# Patient Record
Sex: Female | Born: 1935 | Race: White | Hispanic: No | Marital: Married | Smoking: Never smoker
Health system: Southern US, Community
[De-identification: ages and names within clinical notes are randomized; demographics above are authoritative.]

## PROBLEM LIST (undated history)

## (undated) DIAGNOSIS — I2510 Atherosclerotic heart disease of native coronary artery without angina pectoris: Secondary | ICD-10-CM

## (undated) DIAGNOSIS — I480 Paroxysmal atrial fibrillation: Secondary | ICD-10-CM

## (undated) DIAGNOSIS — I10 Essential (primary) hypertension: Secondary | ICD-10-CM

## (undated) DIAGNOSIS — M8580 Other specified disorders of bone density and structure, unspecified site: Secondary | ICD-10-CM

## (undated) DIAGNOSIS — L570 Actinic keratosis: Secondary | ICD-10-CM

## (undated) DIAGNOSIS — M791 Myalgia, unspecified site: Secondary | ICD-10-CM

## (undated) DIAGNOSIS — C801 Malignant (primary) neoplasm, unspecified: Secondary | ICD-10-CM

## (undated) DIAGNOSIS — H409 Unspecified glaucoma: Secondary | ICD-10-CM

## (undated) DIAGNOSIS — I7300 Raynaud's syndrome without gangrene: Secondary | ICD-10-CM

## (undated) DIAGNOSIS — I252 Old myocardial infarction: Secondary | ICD-10-CM

## (undated) HISTORY — PX: BREAST SURGERY: SHX581

---

## 2000-02-02 DIAGNOSIS — I252 Old myocardial infarction: Secondary | ICD-10-CM

## 2000-02-02 HISTORY — DX: Old myocardial infarction: I25.2

## 2006-04-06 ENCOUNTER — Emergency Department (HOSPITAL_COMMUNITY): Admission: EM | Admit: 2006-04-06 | Discharge: 2006-04-06 | Payer: Self-pay | Admitting: Emergency Medicine

## 2007-03-25 ENCOUNTER — Emergency Department: Payer: Self-pay | Admitting: Emergency Medicine

## 2008-09-16 ENCOUNTER — Ambulatory Visit: Payer: Self-pay | Admitting: Internal Medicine

## 2008-11-30 ENCOUNTER — Emergency Department: Payer: Self-pay | Admitting: Emergency Medicine

## 2009-05-19 ENCOUNTER — Ambulatory Visit: Payer: Self-pay | Admitting: Internal Medicine

## 2009-10-04 ENCOUNTER — Ambulatory Visit: Payer: Self-pay | Admitting: Internal Medicine

## 2010-06-11 ENCOUNTER — Ambulatory Visit: Payer: Self-pay | Admitting: Urology

## 2010-08-31 DIAGNOSIS — Z8589 Personal history of malignant neoplasm of other organs and systems: Secondary | ICD-10-CM | POA: Insufficient documentation

## 2010-08-31 DIAGNOSIS — Z853 Personal history of malignant neoplasm of breast: Secondary | ICD-10-CM | POA: Insufficient documentation

## 2010-08-31 DIAGNOSIS — K5909 Other constipation: Secondary | ICD-10-CM | POA: Insufficient documentation

## 2010-08-31 DIAGNOSIS — H4020X0 Unspecified primary angle-closure glaucoma, stage unspecified: Secondary | ICD-10-CM | POA: Insufficient documentation

## 2010-08-31 DIAGNOSIS — L209 Atopic dermatitis, unspecified: Secondary | ICD-10-CM | POA: Insufficient documentation

## 2010-08-31 DIAGNOSIS — Z872 Personal history of diseases of the skin and subcutaneous tissue: Secondary | ICD-10-CM | POA: Insufficient documentation

## 2010-08-31 DIAGNOSIS — G4700 Insomnia, unspecified: Secondary | ICD-10-CM | POA: Insufficient documentation

## 2011-03-31 DIAGNOSIS — M8580 Other specified disorders of bone density and structure, unspecified site: Secondary | ICD-10-CM | POA: Insufficient documentation

## 2011-03-31 DIAGNOSIS — I480 Paroxysmal atrial fibrillation: Secondary | ICD-10-CM | POA: Insufficient documentation

## 2011-03-31 DIAGNOSIS — M791 Myalgia, unspecified site: Secondary | ICD-10-CM | POA: Insufficient documentation

## 2012-03-30 DIAGNOSIS — I7300 Raynaud's syndrome without gangrene: Secondary | ICD-10-CM | POA: Insufficient documentation

## 2013-04-17 DIAGNOSIS — R0989 Other specified symptoms and signs involving the circulatory and respiratory systems: Secondary | ICD-10-CM | POA: Insufficient documentation

## 2013-09-07 DIAGNOSIS — I10 Essential (primary) hypertension: Secondary | ICD-10-CM | POA: Insufficient documentation

## 2013-09-07 DIAGNOSIS — J302 Other seasonal allergic rhinitis: Secondary | ICD-10-CM | POA: Insufficient documentation

## 2014-04-08 DIAGNOSIS — E7800 Pure hypercholesterolemia, unspecified: Secondary | ICD-10-CM | POA: Insufficient documentation

## 2014-04-08 DIAGNOSIS — R252 Cramp and spasm: Secondary | ICD-10-CM | POA: Insufficient documentation

## 2014-04-08 DIAGNOSIS — I25118 Atherosclerotic heart disease of native coronary artery with other forms of angina pectoris: Secondary | ICD-10-CM | POA: Insufficient documentation

## 2014-08-11 ENCOUNTER — Encounter: Payer: Self-pay | Admitting: Emergency Medicine

## 2014-08-11 ENCOUNTER — Emergency Department
Admission: EM | Admit: 2014-08-11 | Discharge: 2014-08-12 | Disposition: A | Discharge status: Home / Self Care | Payer: Medicare Other | Attending: Emergency Medicine | Admitting: Emergency Medicine

## 2014-08-11 DIAGNOSIS — Z880 Allergy status to penicillin: Secondary | ICD-10-CM | POA: Diagnosis not present

## 2014-08-11 DIAGNOSIS — L03116 Cellulitis of left lower limb: Secondary | ICD-10-CM | POA: Insufficient documentation

## 2014-08-11 DIAGNOSIS — L539 Erythematous condition, unspecified: Secondary | ICD-10-CM | POA: Diagnosis present

## 2014-08-11 DIAGNOSIS — Z87828 Personal history of other (healed) physical injury and trauma: Secondary | ICD-10-CM | POA: Diagnosis not present

## 2014-08-11 DIAGNOSIS — I10 Essential (primary) hypertension: Secondary | ICD-10-CM | POA: Insufficient documentation

## 2014-08-11 HISTORY — DX: Other specified disorders of bone density and structure, unspecified site: M85.80

## 2014-08-11 HISTORY — DX: Myalgia, unspecified site: M79.10

## 2014-08-11 HISTORY — DX: Essential (primary) hypertension: I10

## 2014-08-11 HISTORY — DX: Unspecified glaucoma: H40.9

## 2014-08-11 HISTORY — DX: Malignant (primary) neoplasm, unspecified: C80.1

## 2014-08-11 HISTORY — DX: Old myocardial infarction: I25.2

## 2014-08-11 HISTORY — DX: Actinic keratosis: L57.0

## 2014-08-11 HISTORY — DX: Paroxysmal atrial fibrillation: I48.0

## 2014-08-11 HISTORY — DX: Atherosclerotic heart disease of native coronary artery without angina pectoris: I25.10

## 2014-08-11 HISTORY — DX: Raynaud's syndrome without gangrene: I73.00

## 2014-08-11 NOTE — ED Notes (Signed)
Hit left shin on stool 10 days ago and has become more red

## 2014-08-11 NOTE — ED Notes (Signed)
Attempted iv x 3 with no success.

## 2014-08-12 DIAGNOSIS — L03116 Cellulitis of left lower limb: Secondary | ICD-10-CM | POA: Diagnosis not present

## 2014-08-12 LAB — BASIC METABOLIC PANEL
Anion gap: 9 (ref 5–15)
BUN: 17 mg/dL (ref 6–20)
CHLORIDE: 103 mmol/L (ref 101–111)
CO2: 27 mmol/L (ref 22–32)
Calcium: 8.9 mg/dL (ref 8.9–10.3)
Creatinine, Ser: 0.93 mg/dL (ref 0.44–1.00)
GFR calc Af Amer: >60 mL/min (ref >60)
GFR, EST NON AFRICAN AMERICAN: 56 mL/min — AB (ref >60)
Glucose, Bld: 153 mg/dL — ABNORMAL HIGH (ref 65–99)
POTASSIUM: 4.2 mmol/L (ref 3.5–5.1)
Sodium: 139 mmol/L (ref 135–145)

## 2014-08-12 LAB — CBC
HEMATOCRIT: 38.4 % (ref 35.0–47.0)
Hemoglobin: 12.6 g/dL (ref 12.0–16.0)
MCH: 30.5 pg (ref 26.0–34.0)
MCHC: 32.8 g/dL (ref 32.0–36.0)
MCV: 92.9 fL (ref 80.0–100.0)
PLATELETS: 177 10*3/uL (ref 150–440)
RBC: 4.13 MIL/uL (ref 3.80–5.20)
RDW: 14.1 % (ref 11.5–14.5)
WBC: 5.8 10*3/uL (ref 3.6–11.0)

## 2014-08-12 MED ORDER — CLINDAMYCIN HCL 150 MG PO CAPS
300.0000 mg | ORAL_CAPSULE | Freq: Once | ORAL | Status: AC
  Administered 2014-08-12: 300 mg via ORAL
  Filled 2014-08-12: qty 2

## 2014-08-12 MED ORDER — CLINDAMYCIN HCL 300 MG PO CAPS: 300.0000 mg | ORAL_CAPSULE | Freq: Three times a day (TID) | ORAL | Status: DC

## 2014-08-12 MED ORDER — CLINDAMYCIN PHOSPHATE 600 MG/50ML IV SOLN: 600.0000 mg | Freq: Once | INTRAVENOUS | Status: DC

## 2014-08-12 NOTE — Discharge Instructions (Signed)

## 2014-08-12 NOTE — ED Provider Notes (Signed)
[facility] Emergency Department Provider Note  ____________________________________________  Time seen: Approximately 0039 AM  I have reviewed the triage vital signs and the nursing notes.   HISTORY  Chief Complaint Abrasion    HPI JACHELLE FLUTY is a 81 y.o. female who reports that she hit her shin on an upholsterer footstool approximately 10 days ago. The patient reports it was slightly red initially but it seemed to get worse today. The patient denies any significant pain but reports that if she put something on it such as antibiotic ointment or Vaseline it did feel like it would be burning. The patient denies any fevers nausea or vomiting. She reports that the pain in her leg is a 0 out of 10 in intensity. She reports though that the area is more warm and red today than previous. The patient was concerned about infection so she decided to come in for evaluation.   Past Medical History  Diagnosis Date  . Hypertension   . Cancer   . MI, old 2002  . Keratosis   . Glaucoma   . Myalgia   . Osteopenia   . Paroxysmal a-fib   . Raynaud disease   . CAD (coronary artery disease)     There are no active problems to display for this patient.   Past Surgical History  Procedure Laterality Date  . Breast surgery      Current Outpatient Rx  Name  Route  Sig  Dispense  Refill  . clindamycin (CLEOCIN) 300 MG capsule   Oral   Take 1 capsule (300 mg total) by mouth 3 (three) times daily.   30 capsule   0     Allergies Erythromycin; Sulfites; Ciprofloxacin; Niacin and related; Penicillins; Ramipril; Septra; Statins; Griseofulvin; and Macrobid  History reviewed. No pertinent family history.  Social History History  Substance Use Topics  . Smoking status: Never Smoker   . Smokeless tobacco: Not on file  . Alcohol Use: No    Review of Systems Constitutional: No fever/chills Eyes: No visual changes. ENT: No sore throat. Cardiovascular: Denies  chest pain. Respiratory: Denies shortness of breath. Gastrointestinal: No abdominal pain.  No nausea, no vomiting.  No diarrhea.  No constipation. Genitourinary: Negative for dysuria. Musculoskeletal: Negative for back pain. Skin: erythema to left shin Neurological: Negative for headaches, focal weakness or numbness.  10-point ROS otherwise negative.  ____________________________________________   PHYSICAL EXAM:  VITAL SIGNS: ED Triage Vitals  Enc Vitals Group     BP 08/11/14 2138 153/63 mmHg     Pulse Rate 08/11/14 2138 73     Resp 08/11/14 2138 16     Temp 08/11/14 2138 98.4 F (36.9 C)     Temp Source 08/11/14 2138 Oral     SpO2 08/11/14 2138 98 %     Weight 08/11/14 2138 154 lb (69.854 kg)     Height 08/11/14 2138 5\' 7"  (1.702 m)     Head Cir --      Peak Flow --      Pain Score 08/11/14 2359 0     Pain Loc --      Pain Edu? --      Excl. in [place]? --     Constitutional: Alert and oriented. Well appearing and in no acute distress. Eyes: Conjunctivae are normal. PERRL. EOMI. Head: Atraumatic. Nose: No congestion/rhinnorhea. Mouth/Throat: Mucous membranes are moist.  Oropharynx non-erythematous. Hematological/Lymphatic/Immunilogical: No cervical lymphadenopathy. Cardiovascular: Normal rate, regular rhythm. Grossly normal heart sounds.  Good peripheral  circulation. Respiratory: Normal respiratory effort.  No retractions. Lungs CTAB. Gastrointestinal: Soft and nontender. No distention. Active bowel sounds Genitourinary: deferred Musculoskeletal: No lower extremity tenderness nor edema.  . Neurologic:  Normal speech and language.  Skin:  Skin is warm, dry, healing skin tear with surrounding erythema to left shin nontender to palpation Psychiatric: Mood and affect are normal.   ____________________________________________   LABS (all labs ordered are listed, but only abnormal results are displayed)  Labs Reviewed  BASIC METABOLIC PANEL - Abnormal; Notable for the  following:    Glucose, Bld 153 (*)    GFR calc non Af Amer 56 (*)    All other components within normal limits  CULTURE, BLOOD (ROUTINE X 2)  CULTURE, BLOOD (ROUTINE X 2)  CBC   ____________________________________________  EKG  None  ____________________________________________  RADIOLOGY  none ____________________________________________   PROCEDURES  Procedure(s) performed: None  Critical Care performed: No  ____________________________________________   INITIAL IMPRESSION / ASSESSMENT AND PLAN / ED COURSE  Pertinent labs & imaging results that were available during my care of the patient were reviewed by me and considered in my medical decision making (see chart for details).  This is an 81 year old female who comes in today with erythema to her left shin with a concern for cellulitis. We did do some blood work which did not show a significantly elevated white blood cell count. The patient receive a dose of clindamycin over discharged home to follow-up with her primary care physician. The patient does not have any significant pain has not had any fevers or any other concerns.  Patient received a dose of clindamycin. Her blood work is unremarkable. She'll be discharged home to follow-up with her primary care physician. ____________________________________________   FINAL CLINICAL IMPRESSION(S) / ED DIAGNOSES  Final diagnoses:  Cellulitis of left lower extremity      Loney Hering, MD 08/12/14 [phone]

## 2014-10-14 DIAGNOSIS — Z79899 Other long term (current) drug therapy: Secondary | ICD-10-CM | POA: Insufficient documentation

## 2014-12-11 ENCOUNTER — Ambulatory Visit
Admission: EM | Admit: 2014-12-11 | Discharge: 2014-12-11 | Disposition: A | Discharge status: Home / Self Care | Payer: Medicare Other | Attending: Family Medicine | Admitting: Family Medicine

## 2014-12-11 ENCOUNTER — Ambulatory Visit: Payer: Medicare Other

## 2014-12-11 DIAGNOSIS — I2510 Atherosclerotic heart disease of native coronary artery without angina pectoris: Secondary | ICD-10-CM | POA: Diagnosis not present

## 2014-12-11 DIAGNOSIS — M25511 Pain in right shoulder: Secondary | ICD-10-CM | POA: Diagnosis present

## 2014-12-11 DIAGNOSIS — M8580 Other specified disorders of bone density and structure, unspecified site: Secondary | ICD-10-CM | POA: Diagnosis not present

## 2014-12-11 DIAGNOSIS — W19XXXA Unspecified fall, initial encounter: Secondary | ICD-10-CM | POA: Insufficient documentation

## 2014-12-11 DIAGNOSIS — S42291A Other displaced fracture of upper end of right humerus, initial encounter for closed fracture: Secondary | ICD-10-CM | POA: Diagnosis not present

## 2014-12-11 DIAGNOSIS — Z7982 Long term (current) use of aspirin: Secondary | ICD-10-CM | POA: Diagnosis not present

## 2014-12-11 DIAGNOSIS — I480 Paroxysmal atrial fibrillation: Secondary | ICD-10-CM | POA: Diagnosis not present

## 2014-12-11 DIAGNOSIS — I10 Essential (primary) hypertension: Secondary | ICD-10-CM | POA: Insufficient documentation

## 2014-12-11 MED ORDER — HYDROCODONE-ACETAMINOPHEN 5-325 MG PO TABS: 1.0000 | ORAL_TABLET | Freq: Four times a day (QID) | ORAL | Status: DC | PRN

## 2014-12-11 NOTE — ED Notes (Signed)
Pt fell on concrete sidewalk about 1 hour ago; pt tripped. Pt has abrasion on left palm and knee, and has right shoulder pain.

## 2014-12-11 NOTE — ED Provider Notes (Signed)
CSN: [id number]     Arrival date & time 12/11/14  1626 History   First MD Initiated Contact with Patient 12/11/14 1745     Chief Complaint  Patient presents with  . Fall  . Shoulder Pain   (Consider location/radiation/quality/duration/timing/severity/associated sxs/prior Treatment) HPI Comments: 81 yo female with a complaint of right shoulder pain and left palm/hand abrasion after tripping while walking and falling on the sidewalk. Denies hitting her head, loss of consciousness, numbness/tingling.  The history is provided by the patient.    Past Medical History  Diagnosis Date  . Hypertension   . Cancer ([place])   . MI, old 2002  . Keratosis   . Glaucoma   . Myalgia   . Osteopenia   . Paroxysmal a-fib ([place])   . Raynaud disease   . CAD (coronary artery disease)    Past Surgical History  Procedure Laterality Date  . Breast surgery     No family history on file. Social History  Substance Use Topics  . Smoking status: Never Smoker   . Smokeless tobacco: None  . Alcohol Use: No   OB History    No data available     Review of Systems  Allergies  Erythromycin; Sulfites; Ciprofloxacin; Niacin and related; Penicillins; Ramipril; Septra; Statins; Griseofulvin; and Macrobid  Home Medications   Prior to Admission medications   Medication Sig Start Date End Date Taking? Authorizing Provider  aspirin 325 MG tablet Take 325 mg by mouth daily.   Yes Historical Provider, MD  Cholecalciferol (VITAMIN D) 2000 UNITS tablet Take 2,000 Units by mouth daily.   Yes Historical Provider, MD  fexofenadine (ALLEGRA) 180 MG tablet Take 180 mg by mouth daily.   Yes Historical Provider, MD  fluocinonide (LIDEX) 0.05 % external solution Apply 1 application topically 2 (two) times daily.   Yes Historical Provider, MD  fluticasone (FLONASE) 50 MCG/ACT nasal spray Place into both nostrils daily.   Yes Historical Provider, MD  losartan (COZAAR) 50 MG tablet Take 50 mg by mouth daily.   Yes  Historical Provider, MD  Magnesium Oxide 250 MG TABS Take by mouth.   Yes Historical Provider, MD  metoprolol succinate (TOPROL-XL) 100 MG 24 hr tablet Take 100 mg by mouth daily. Take with or immediately following a meal.   Yes Historical Provider, MD  nitroGLYCERIN (NITROSTAT) 0.4 MG SL tablet Place 0.4 mg under the tongue every 5 (five) minutes as needed for chest pain.   Yes Historical Provider, MD  Omega-3 Fatty Acids (FISH OIL) 1200 MG CAPS Take by mouth.   Yes Historical Provider, MD  polyethylene glycol (MIRALAX / GLYCOLAX) packet Take 17 g by mouth daily.   Yes Historical Provider, MD  sennosides-docusate sodium (SENOKOT-S) 8.6-50 MG tablet Take 1 tablet by mouth daily.   Yes Historical Provider, MD  tamoxifen (NOLVADEX) 20 MG tablet Take 20 mg by mouth daily.   Yes Historical Provider, MD  clindamycin (CLEOCIN) 300 MG capsule Take 1 capsule (300 mg total) by mouth 3 (three) times daily. 08/12/14   Loney Hering, MD  HYDROcodone-acetaminophen (NORCO/VICODIN) 5-325 MG tablet Take 1 tablet by mouth every 6 (six) hours as needed. 12/11/14   Norval Gable, MD   Meds Ordered and Administered this Visit  Medications - No data to display  BP 140/55 mmHg  Pulse 66  Temp(Src) 97.6 F (36.4 C) (Oral)  Resp 16  Ht 5\' 7"  (1.702 m)  Wt 154 lb (69.854 kg)  BMI 24.11 kg/m2  SpO2 100% No  data found.   Physical Exam  Constitutional: She appears well-developed and well-nourished. No distress.  Musculoskeletal:       Right shoulder: She exhibits decreased range of motion, tenderness and bony tenderness. She exhibits no swelling, no effusion, no crepitus, no deformity, no laceration, no spasm and normal pulse.  Right upper extremity neurovascularly intact  Neurological: She is alert.  Skin: She is not diaphoretic.     Superficial abrasion to left palm skin; no foreign bodies visualized  Nursing note and vitals reviewed.   ED Course  Procedures (including critical care time)  Labs  Review Labs Reviewed - No data to display  Imaging Review Dg Shoulder Right  12/11/2014  CLINICAL DATA:  Patient fell today stumbling over crack in side walk injuring right shoulder. Most pain in right proximal humeral head into mid humerus. Limited range of motion. EXAM: RIGHT SHOULDER - 2+ VIEW COMPARISON:  None. FINDINGS: Suspect nondisplaced minimally comminuted fracture of the lateral humeral head/neck. No intra-articular extension. No additional fracture. Glenohumeral and acromioclavicular alignment is maintained. Surgical clips noted in the right axilla. IMPRESSION: Suspect nondisplaced minimally comminuted fracture of the lateral humeral head/neck. Electronically Signed   By: Jeb Levering M.D.   On: 12/11/2014 18:56     Visual Acuity Review  Right Eye Distance:   Left Eye Distance:   Bilateral Distance:    Right Eye Near:   Left Eye Near:    Bilateral Near:         MDM   1. Humeral head fracture, right, closed, initial encounter    Discharge Medication List as of 12/11/2014  7:17 PM    START taking these medications   Details  HYDROcodone-acetaminophen (NORCO/VICODIN) 5-325 MG tablet Take 1 tablet by mouth every 6 (six) hours as needed., Starting 12/11/2014, Until Discontinued, Print      1.x-ray results and diagnosis reviewed with patient 2. rx as per orders above; reviewed possible side effects, interactions, risks and benefits  3. Right arm/shoulder immobilized with sling 4. Recommend supportive treatment with rest, ice, elevation  4. Follow-up with orthopedist in next 3-5 days or sooner prn if any worsening symptoms  Norval Gable, MD 12/11/14 2053

## 2014-12-11 NOTE — Discharge Instructions (Signed)
Humerus Fracture Treated With Immobilization °The humerus is the large bone in your upper arm. You have a broken (fractured) humerus. These fractures are easily diagnosed with X-rays. °TREATMENT  °Simple fractures which will heal without disability are treated with simple immobilization. Immobilization means you will wear a cast, splint, or sling. You have a fracture which will do well with immobilization. The fracture will heal well simply by being held in a good position until it is stable enough to begin range of motion exercises. Do not take part in activities which would further injure your arm.  °HOME CARE INSTRUCTIONS  °· Put ice on the injured area. °¨ Put ice in a plastic bag. °¨ Place a towel between your skin and the bag. °¨ Leave the ice on for 15-20 minutes, 03-04 times a day. °· If you have a cast: °¨ Do not scratch the skin under the cast using sharp or pointed objects. °¨ Check the skin around the cast every day. You may put lotion on any red or sore areas. °¨ Keep your cast dry and clean. °· If you have a splint: °¨ Wear the splint as directed. °¨ Keep your splint dry and clean. °¨ You may loosen the elastic around the splint if your fingers become numb, tingle, or turn cold or blue. °· If you have a sling: °¨ Wear the sling as directed. °· Do not put pressure on any part of your cast or splint until it is fully hardened. °· Your cast or splint can be protected during bathing with a plastic bag. Do not lower the cast or splint into water. °· Only take over-the-counter or prescription medicines for pain, discomfort, or fever as directed by your caregiver. °· Do range of motion exercises as instructed by your caregiver. °· Follow up as directed by your caregiver. This is very important in order to avoid permanent injury or disability and chronic pain. °SEEK IMMEDIATE MEDICAL CARE IF:  °· Your skin or nails in the injured arm turn blue or gray. °· Your arm feels cold or numb. °· You develop severe pain  in the injured arm. °· You are having problems with the medicines you were given. °MAKE SURE YOU:  °· Understand these instructions. °· Will watch your condition. °· Will get help right away if you are not doing well or get worse. °  °This information is not intended to replace advice given to you by your health care provider. Make sure you discuss any questions you have with your health care provider. °  °Document Released: 04/26/2000 Document Revised: 02/08/2014 Document Reviewed: 06/12/2014 °Elsevier Interactive Patient Education ©2016 [employer] ° °

## 2015-04-18 DIAGNOSIS — N183 Chronic kidney disease, stage 3 unspecified: Secondary | ICD-10-CM | POA: Insufficient documentation

## 2015-05-09 DIAGNOSIS — G5603 Carpal tunnel syndrome, bilateral upper limbs: Secondary | ICD-10-CM | POA: Insufficient documentation

## 2015-09-07 DIAGNOSIS — I10 Essential (primary) hypertension: Secondary | ICD-10-CM | POA: Insufficient documentation

## 2015-09-07 DIAGNOSIS — Z859 Personal history of malignant neoplasm, unspecified: Secondary | ICD-10-CM | POA: Insufficient documentation

## 2015-09-07 DIAGNOSIS — R110 Nausea: Secondary | ICD-10-CM | POA: Insufficient documentation

## 2015-09-07 DIAGNOSIS — Z79899 Other long term (current) drug therapy: Secondary | ICD-10-CM | POA: Diagnosis not present

## 2015-09-07 DIAGNOSIS — I2510 Atherosclerotic heart disease of native coronary artery without angina pectoris: Secondary | ICD-10-CM | POA: Diagnosis not present

## 2015-09-07 DIAGNOSIS — R000 Tachycardia, unspecified: Secondary | ICD-10-CM | POA: Diagnosis present

## 2015-09-07 DIAGNOSIS — I252 Old myocardial infarction: Secondary | ICD-10-CM | POA: Diagnosis not present

## 2015-09-07 DIAGNOSIS — R002 Palpitations: Secondary | ICD-10-CM | POA: Insufficient documentation

## 2015-09-07 DIAGNOSIS — Z792 Long term (current) use of antibiotics: Secondary | ICD-10-CM | POA: Insufficient documentation

## 2015-09-07 DIAGNOSIS — Z7982 Long term (current) use of aspirin: Secondary | ICD-10-CM | POA: Insufficient documentation

## 2015-09-07 NOTE — ED Triage Notes (Signed)
Pt presents to ED with c/o tachycardia that started around 10pm. Pt reports h/x of MI in 2001. Pt also reports h/x of Afib. Pt reports (+) nausea, but no vomiting, Pt states some lightheadedness, but denies shortness of breath. Pt states her HR "was up to 80 when she checked her BP (pt reports BP was 153/82). Pt is A&O, in NAD, with respirations even, regular, and unlabored.

## 2015-09-08 ENCOUNTER — Emergency Department: Payer: Medicare Other

## 2015-09-08 ENCOUNTER — Emergency Department
Admission: EM | Admit: 2015-09-08 | Discharge: 2015-09-08 | Disposition: A | Discharge status: Home / Self Care | Payer: Medicare Other | Attending: Emergency Medicine | Admitting: Emergency Medicine

## 2015-09-08 DIAGNOSIS — R002 Palpitations: Secondary | ICD-10-CM

## 2015-09-08 LAB — CBC
HEMATOCRIT: 39.6 % (ref 35.0–47.0)
HEMOGLOBIN: 13.7 g/dL (ref 12.0–16.0)
MCH: 30.4 pg (ref 26.0–34.0)
MCHC: 34.5 g/dL (ref 32.0–36.0)
MCV: 88.1 fL (ref 80.0–100.0)
Platelets: 183 10*3/uL (ref 150–440)
RBC: 4.5 MIL/uL (ref 3.80–5.20)
RDW: 14.6 % — ABNORMAL HIGH (ref 11.5–14.5)
WBC: 5.3 10*3/uL (ref 3.6–11.0)

## 2015-09-08 LAB — BASIC METABOLIC PANEL
ANION GAP: 8 (ref 5–15)
BUN: 17 mg/dL (ref 6–20)
CALCIUM: 9.1 mg/dL (ref 8.9–10.3)
CHLORIDE: 103 mmol/L (ref 101–111)
CO2: 26 mmol/L (ref 22–32)
Creatinine, Ser: 0.93 mg/dL (ref 0.44–1.00)
GFR calc non Af Amer: 56 mL/min — ABNORMAL LOW (ref >60)
Glucose, Bld: 98 mg/dL (ref 65–99)
POTASSIUM: 4.1 mmol/L (ref 3.5–5.1)
Sodium: 137 mmol/L (ref 135–145)

## 2015-09-08 LAB — TROPONIN I: Troponin I: <0.03 ng/mL (ref <0.03)

## 2015-09-08 NOTE — Discharge Instructions (Signed)
Please return immediately if condition worsens. Please contact her primary physician or the physician you were given for referral. If you have any specialist physicians involved in her treatment and plan please also contact them. Thank you for using [facility]. Please continue with your current medications and regimen Return to emergency department especially for chest or back pain, shortness of breath, or any other new concerns. Please contact your cardiologist later today for further follow-up and management

## 2015-09-08 NOTE — ED Provider Notes (Signed)
Time Seen: Approximately 1231 I have reviewed the triage notes  Chief Complaint: Tachycardia   History of Present Illness: Brandy Hunt is a 82 y.o. female who presents with some feelings of tachycardia and heart palpitations at home. Patient was concerned because she's had previous cardiac ischemia and a history of atrial fibrillation. She describes some lightheadedness and some brief nausea. She states she feels fine at present and felt that her heart rate was up to 80 "". Patient denies any chest or back pain. She denies any arm or jaw discomfort. Patient currently takes Toprol for paroxysmal atrial fibrillation. She is not currently on any anticoagulation therapy.   Past Medical History:  Diagnosis Date  . CAD (coronary artery disease)   . Cancer ([place])   . Glaucoma   . Hypertension   . Keratosis   . MI, old 2002  . Myalgia   . Osteopenia   . Paroxysmal a-fib ([place])   . Raynaud disease     There are no active problems to display for this patient.   Past Surgical History:  Procedure Laterality Date  . BREAST SURGERY      Past Surgical History:  Procedure Laterality Date  . BREAST SURGERY      Current Outpatient Rx  . Order #: [id number] Class: Historical Med  . Order #: [id number] Class: Historical Med  . Order #: [id number] Class: Print  . Order #: [id number] Class: Historical Med  . Order #: [id number] Class: Historical Med  . Order #: [id number] Class: Historical Med  . Order #: [id number] Class: Print  . Order #: [id number] Class: Historical Med  . Order #: [id number] Class: Historical Med  . Order #: [id number] Class: Historical Med  . Order #: [id number] Class: Historical Med  . Order #: [id number] Class: Historical Med  . Order #: [id number] Class: Historical Med  . Order #: [id number] Class: Historical Med  . Order #: [id number] Class: Historical Med    Allergies:  Erythromycin; Sulfites; Ciprofloxacin; Niacin and related; Penicillins; Ramipril; Septra [sulfamethoxazole-trimethoprim];  Statins; Griseofulvin; and Macrobid [nitrofurantoin monohyd macro]  Family History: No family history on file.  Social History: Social History  Substance Use Topics  . Smoking status: Never Smoker  . Smokeless tobacco: Never Used  . Alcohol use No     Review of Systems:   10 point review of systems was performed and was otherwise negative:  Constitutional: No fever Eyes: No visual disturbances ENT: No sore throat, ear pain Cardiac: No chest pain Respiratory: No shortness of breath, wheezing, or stridor Abdomen: No abdominal pain, no vomiting, No diarrhea Endocrine: No weight loss, No night sweats Extremities: No peripheral edema, cyanosis Skin: No rashes, easy bruising Neurologic: No focal weakness, trouble with speech or swollowing Urologic: No dysuria, Hematuria, or urinary frequency   Physical Exam:  ED Triage Vitals  Enc Vitals Group     BP 09/08/15 0003 (!) 185/71     Pulse Rate 09/08/15 0003 78     Resp 09/08/15 0100 19     Temp 09/08/15 0003 97.8 F (36.6 C)     Temp Source 09/08/15 0003 Oral     SpO2 09/08/15 0003 97 %     Weight 09/08/15 0003 155 lb (70.3 kg)     Height 09/08/15 0003 5\' 7"  (1.702 m)     Head Circumference --      Peak Flow --      Pain Score 09/08/15 0004 0     Pain Loc --      Pain Edu? --      Excl.  in [place]? --     General: Awake , Alert , and Oriented times 3; GCS 15 Head: Normal cephalic , atraumatic Eyes: Pupils equal , round, reactive to light Nose/Throat: No nasal drainage, patent upper airway without erythema or exudate.  Neck: Supple, Full range of motion, No anterior adenopathy or palpable thyroid masses Lungs: Clear to ascultation without wheezes , rhonchi, or rales Heart: Regular rate, regular rhythm without murmurs , gallops , or rubs Abdomen: Soft, non tender without rebound, guarding , or rigidity; bowel sounds positive and symmetric in all 4 quadrants. No organomegaly .        Extremities: 2 plus symmetric pulses. No  edema, clubbing or cyanosis Neurologic: normal ambulation, Motor symmetric without deficits, sensory intact Skin: warm, dry, no rashes   Labs:   All laboratory work was reviewed including any pertinent negatives or positives listed below:  Labs Reviewed  BASIC METABOLIC PANEL - Abnormal; Notable for the following:       Result Value   GFR calc non Af Amer 56 (*)    All other components within normal limits  CBC - Abnormal; Notable for the following:    RDW 14.6 (*)    All other components within normal limits  TROPONIN I  Laboratory work was reviewed and showed no clinically significant abnormalities.   EKG: * ED ECG REPORT I, Daymon Larsen, the attending physician, personally viewed and interpreted this ECG.  Date: 09/08/2015 EKG Time: 0314 Rate: 79 Rhythm: normal sinus rhythm QRS Axis: normal Intervals: normal ST/T Wave abnormalities: Nonspecific ST and T-wave abnormalities Conduction Disturbances: none Narrative Interpretation: unremarkable No previous EKGs available for comparison   Radiology:   EXAM: CHEST  2 VIEW  COMPARISON:  None.  FINDINGS: Cardiac silhouette is normal in size, mildly tortuous aorta associated with hypertension. No pleural effusion or focal consolidation. Mild bronchitic changes. No pneumothorax. Surgical clips RIGHT breast, biopsy clips LEFT breast. Osteopenia.  IMPRESSION: Mild bronchitic changes.   Electronically Signed   By: Elon Alas M.D.   On: 09/08/2015 00:45    I personally reviewed the radiologic studies    ED Course:   Patient's stay here was uneventful. She was placed on a cardiac monitor and had a consistent heart rate in the 70s. She still exhibits no symptoms at this time. I felt she may have had a brief episode of atrial fibrillation but likely her Toprol which she takes in the evening took a fact and the patient is currently in a normal sinus rhythm. No evidence of ischemic heart disease at this  time.     Clinical Course     Assessment: *Heart palpitations    Plan:  Outpatient Follow-up with the cardiologist. Patient was advised to contact her cardiologist later this morning for follow-up as directed Patient was advised to return immediately if condition worsens. Patient was advised to follow up with their primary care physician or other specialized physicians involved in their outpatient care. The patient and/or family member/power of attorney had laboratory results reviewed at the bedside. All questions and concerns were addressed and appropriate discharge instructions were distributed by the nursing staff.             Daymon Larsen, MD 09/08/15 [phone]

## 2015-09-08 NOTE — ED Notes (Signed)
Pt discharged to home.  Family member driving.  Discharge instructions reviewed.  Verbalized understanding.  No questions or concerns at this time.  Teach back verified.  Pt in NAD.  No items left in ED.   

## 2015-09-10 DIAGNOSIS — R002 Palpitations: Secondary | ICD-10-CM | POA: Insufficient documentation

## 2015-10-20 DIAGNOSIS — F419 Anxiety disorder, unspecified: Secondary | ICD-10-CM | POA: Insufficient documentation

## 2016-10-20 DIAGNOSIS — E875 Hyperkalemia: Secondary | ICD-10-CM | POA: Insufficient documentation

## 2017-03-17 DIAGNOSIS — R269 Unspecified abnormalities of gait and mobility: Secondary | ICD-10-CM | POA: Insufficient documentation

## 2018-06-08 DIAGNOSIS — R7302 Impaired glucose tolerance (oral): Secondary | ICD-10-CM | POA: Insufficient documentation

## 2018-06-08 DIAGNOSIS — E538 Deficiency of other specified B group vitamins: Secondary | ICD-10-CM | POA: Insufficient documentation

## 2018-06-08 DIAGNOSIS — R29898 Other symptoms and signs involving the musculoskeletal system: Secondary | ICD-10-CM | POA: Insufficient documentation

## 2018-08-10 DIAGNOSIS — R200 Anesthesia of skin: Secondary | ICD-10-CM | POA: Insufficient documentation

## 2018-08-10 DIAGNOSIS — M79641 Pain in right hand: Secondary | ICD-10-CM | POA: Insufficient documentation

## 2018-08-10 DIAGNOSIS — M79642 Pain in left hand: Secondary | ICD-10-CM | POA: Insufficient documentation

## 2018-11-25 ENCOUNTER — Observation Stay
Admission: EM | Admit: 2018-11-25 | Discharge: 2018-11-26 | Disposition: A | Discharge status: Home / Self Care | Payer: Medicare Other | Attending: Internal Medicine | Admitting: Internal Medicine

## 2018-11-25 ENCOUNTER — Other Ambulatory Visit: Payer: Self-pay

## 2018-11-25 ENCOUNTER — Observation Stay
Admit: 2018-11-25 | Discharge: 2018-11-25 | Disposition: A | Discharge status: Home / Self Care | Payer: Medicare Other | Attending: Family Medicine | Admitting: Family Medicine

## 2018-11-25 ENCOUNTER — Encounter: Payer: Self-pay | Admitting: Emergency Medicine

## 2018-11-25 DIAGNOSIS — I2510 Atherosclerotic heart disease of native coronary artery without angina pectoris: Secondary | ICD-10-CM | POA: Insufficient documentation

## 2018-11-25 DIAGNOSIS — I4891 Unspecified atrial fibrillation: Secondary | ICD-10-CM

## 2018-11-25 DIAGNOSIS — Z7901 Long term (current) use of anticoagulants: Secondary | ICD-10-CM | POA: Insufficient documentation

## 2018-11-25 DIAGNOSIS — I129 Hypertensive chronic kidney disease with stage 1 through stage 4 chronic kidney disease, or unspecified chronic kidney disease: Secondary | ICD-10-CM | POA: Diagnosis not present

## 2018-11-25 DIAGNOSIS — Z853 Personal history of malignant neoplasm of breast: Secondary | ICD-10-CM | POA: Diagnosis not present

## 2018-11-25 DIAGNOSIS — I4892 Unspecified atrial flutter: Secondary | ICD-10-CM | POA: Diagnosis not present

## 2018-11-25 DIAGNOSIS — I7300 Raynaud's syndrome without gangrene: Secondary | ICD-10-CM | POA: Insufficient documentation

## 2018-11-25 DIAGNOSIS — N183 Chronic kidney disease, stage 3 unspecified: Secondary | ICD-10-CM | POA: Diagnosis not present

## 2018-11-25 DIAGNOSIS — E785 Hyperlipidemia, unspecified: Secondary | ICD-10-CM | POA: Diagnosis not present

## 2018-11-25 DIAGNOSIS — I480 Paroxysmal atrial fibrillation: Secondary | ICD-10-CM | POA: Diagnosis not present

## 2018-11-25 DIAGNOSIS — H409 Unspecified glaucoma: Secondary | ICD-10-CM | POA: Diagnosis not present

## 2018-11-25 DIAGNOSIS — Z20828 Contact with and (suspected) exposure to other viral communicable diseases: Secondary | ICD-10-CM | POA: Diagnosis not present

## 2018-11-25 DIAGNOSIS — Z7951 Long term (current) use of inhaled steroids: Secondary | ICD-10-CM | POA: Insufficient documentation

## 2018-11-25 DIAGNOSIS — N390 Urinary tract infection, site not specified: Secondary | ICD-10-CM | POA: Diagnosis not present

## 2018-11-25 DIAGNOSIS — Z7982 Long term (current) use of aspirin: Secondary | ICD-10-CM | POA: Insufficient documentation

## 2018-11-25 DIAGNOSIS — I252 Old myocardial infarction: Secondary | ICD-10-CM | POA: Diagnosis not present

## 2018-11-25 DIAGNOSIS — Z79899 Other long term (current) drug therapy: Secondary | ICD-10-CM | POA: Diagnosis not present

## 2018-11-25 DIAGNOSIS — M8580 Other specified disorders of bone density and structure, unspecified site: Secondary | ICD-10-CM | POA: Diagnosis not present

## 2018-11-25 LAB — TSH: TSH: 3.401 u[IU]/mL (ref 0.350–4.500)

## 2018-11-25 LAB — COMPREHENSIVE METABOLIC PANEL
ALT: 14 U/L (ref 0–44)
AST: 18 U/L (ref 15–41)
Albumin: 4.6 g/dL (ref 3.5–5.0)
Alkaline Phosphatase: 55 U/L (ref 38–126)
Anion gap: 9 (ref 5–15)
BUN: 18 mg/dL (ref 8–23)
CO2: 26 mmol/L (ref 22–32)
Calcium: 9.3 mg/dL (ref 8.9–10.3)
Chloride: 100 mmol/L (ref 98–111)
Creatinine, Ser: 0.84 mg/dL (ref 0.44–1.00)
GFR calc Af Amer: >60 mL/min (ref >60)
GFR calc non Af Amer: >60 mL/min (ref >60)
Glucose, Bld: 117 mg/dL — ABNORMAL HIGH (ref 70–99)
Potassium: 4 mmol/L (ref 3.5–5.1)
Sodium: 135 mmol/L (ref 135–145)
Total Bilirubin: 0.7 mg/dL (ref 0.3–1.2)
Total Protein: 7.1 g/dL (ref 6.5–8.1)

## 2018-11-25 LAB — URINALYSIS, COMPLETE (UACMP) WITH MICROSCOPIC
Bacteria, UA: NONE SEEN
Bilirubin Urine: NEGATIVE
Glucose, UA: NEGATIVE mg/dL
Ketones, ur: NEGATIVE mg/dL
Nitrite: NEGATIVE
Protein, ur: NEGATIVE mg/dL
Specific Gravity, Urine: 1.005 (ref 1.005–1.030)
Squamous Epithelial / LPF: NONE SEEN (ref 0–5)
pH: 6 (ref 5.0–8.0)

## 2018-11-25 LAB — BASIC METABOLIC PANEL
Anion gap: 11 (ref 5–15)
BUN: 15 mg/dL (ref 8–23)
CO2: 24 mmol/L (ref 22–32)
Calcium: 9.2 mg/dL (ref 8.9–10.3)
Chloride: 104 mmol/L (ref 98–111)
Creatinine, Ser: 0.74 mg/dL (ref 0.44–1.00)
GFR calc Af Amer: >60 mL/min (ref >60)
GFR calc non Af Amer: >60 mL/min (ref >60)
Glucose, Bld: 120 mg/dL — ABNORMAL HIGH (ref 70–99)
Potassium: 4 mmol/L (ref 3.5–5.1)
Sodium: 139 mmol/L (ref 135–145)

## 2018-11-25 LAB — CBC
HCT: 42.7 % (ref 36.0–46.0)
HCT: 41.3 % (ref 36.0–46.0)
Hemoglobin: 13.9 g/dL (ref 12.0–15.0)
Hemoglobin: 13.6 g/dL (ref 12.0–15.0)
MCH: 29.4 pg (ref 26.0–34.0)
MCH: 29.4 pg (ref 26.0–34.0)
MCHC: 32.6 g/dL (ref 30.0–36.0)
MCHC: 32.9 g/dL (ref 30.0–36.0)
MCV: 90.5 fL (ref 80.0–100.0)
MCV: 89.4 fL (ref 80.0–100.0)
Platelets: 206 10*3/uL (ref 150–400)
Platelets: 224 10*3/uL (ref 150–400)
RBC: 4.72 MIL/uL (ref 3.87–5.11)
RBC: 4.62 MIL/uL (ref 3.87–5.11)
RDW: 13.3 % (ref 11.5–15.5)
RDW: 13.4 % (ref 11.5–15.5)
WBC: 6.2 10*3/uL (ref 4.0–10.5)
WBC: 5.9 10*3/uL (ref 4.0–10.5)
nRBC: 0 % (ref 0.0–0.2)
nRBC: 0 % (ref 0.0–0.2)

## 2018-11-25 LAB — SARS CORONAVIRUS 2 (TAT 6-24 HRS): SARS Coronavirus 2: NEGATIVE

## 2018-11-25 LAB — TROPONIN I (HIGH SENSITIVITY)
Troponin I (High Sensitivity): 5 ng/L (ref <18)
Troponin I (High Sensitivity): 12 ng/L (ref <18)

## 2018-11-25 LAB — MAGNESIUM: Magnesium: 2.1 mg/dL (ref 1.7–2.4)

## 2018-11-25 MED ORDER — METOPROLOL SUCCINATE ER 100 MG PO TB24
100.0000 mg | ORAL_TABLET | Freq: Every day | ORAL | Status: DC
  Administered 2018-11-25 – 2018-11-26 (×2): 100 mg via ORAL
  Filled 2018-11-25 (×2): qty 1

## 2018-11-25 MED ORDER — NITROGLYCERIN 0.4 MG SL SUBL: 0.4000 mg | SUBLINGUAL_TABLET | SUBLINGUAL | Status: DC | PRN

## 2018-11-25 MED ORDER — DILTIAZEM HCL 100 MG IV SOLR
5.0000 mg/h | INTRAVENOUS | Status: DC
  Administered 2018-11-25: 6 mg/h via INTRAVENOUS
  Administered 2018-11-25 – 2018-11-26 (×2): 5 mg/h via INTRAVENOUS
  Filled 2018-11-25 (×3): qty 100

## 2018-11-25 MED ORDER — ASPIRIN EC 325 MG PO TBEC: 325.0000 mg | DELAYED_RELEASE_TABLET | Freq: Every day | ORAL | Status: DC

## 2018-11-25 MED ORDER — LOSARTAN POTASSIUM 50 MG PO TABS
50.0000 mg | ORAL_TABLET | Freq: Every day | ORAL | Status: DC
  Administered 2018-11-25: 13:00:00 50 mg via ORAL
  Filled 2018-11-25: qty 1

## 2018-11-25 MED ORDER — APIXABAN 5 MG PO TABS
5.0000 mg | ORAL_TABLET | Freq: Two times a day (BID) | ORAL | Status: DC
  Administered 2018-11-25 – 2018-11-26 (×3): 5 mg via ORAL
  Filled 2018-11-25 (×3): qty 1

## 2018-11-25 MED ORDER — SENNOSIDES-DOCUSATE SODIUM 8.6-50 MG PO TABS
1.0000 | ORAL_TABLET | Freq: Every day | ORAL | Status: DC
  Administered 2018-11-25 – 2018-11-26 (×2): 1 via ORAL
  Filled 2018-11-25 (×2): qty 1

## 2018-11-25 MED ORDER — DILTIAZEM HCL 25 MG/5ML IV SOLN
10.0000 mg | Freq: Once | INTRAVENOUS | Status: AC
  Administered 2018-11-25: 03:00:00 10 mg via INTRAVENOUS

## 2018-11-25 MED ORDER — FLUTICASONE PROPIONATE 50 MCG/ACT NA SUSP
2.0000 | Freq: Every day | NASAL | Status: DC
  Filled 2018-11-25: qty 16

## 2018-11-25 MED ORDER — SODIUM CHLORIDE 0.9 % IV SOLN
INTRAVENOUS | Status: DC
  Administered 2018-11-25 – 2018-11-26 (×2): via INTRAVENOUS

## 2018-11-25 MED ORDER — VITAMIN D 25 MCG (1000 UNIT) PO TABS
2000.0000 [IU] | ORAL_TABLET | Freq: Every day | ORAL | Status: DC
  Administered 2018-11-25 – 2018-11-26 (×2): 2000 [IU] via ORAL
  Filled 2018-11-25 (×2): qty 2

## 2018-11-25 MED ORDER — CLINDAMYCIN HCL 150 MG PO CAPS: 300.0000 mg | ORAL_CAPSULE | Freq: Three times a day (TID) | ORAL | Status: DC

## 2018-11-25 MED ORDER — MAGNESIUM HYDROXIDE 400 MG/5ML PO SUSP: 30.0000 mL | Freq: Every day | ORAL | Status: DC | PRN

## 2018-11-25 MED ORDER — ACETAMINOPHEN 325 MG PO TABS: 650.0000 mg | ORAL_TABLET | Freq: Four times a day (QID) | ORAL | Status: DC | PRN

## 2018-11-25 MED ORDER — DILTIAZEM HCL 100 MG IV SOLR: 5.0000 mg/h | INTRAVENOUS | Status: DC

## 2018-11-25 MED ORDER — LORATADINE 10 MG PO TABS
10.0000 mg | ORAL_TABLET | Freq: Every day | ORAL | Status: DC
  Administered 2018-11-25 – 2018-11-26 (×2): 10 mg via ORAL
  Filled 2018-11-25 (×2): qty 1

## 2018-11-25 MED ORDER — PROMETHAZINE HCL 25 MG PO TABS
12.5000 mg | ORAL_TABLET | Freq: Four times a day (QID) | ORAL | Status: DC | PRN
  Filled 2018-11-25: qty 1

## 2018-11-25 MED ORDER — OMEGA-3-ACID ETHYL ESTERS 1 G PO CAPS
1.0000 g | ORAL_CAPSULE | Freq: Every day | ORAL | Status: DC
  Administered 2018-11-25 – 2018-11-26 (×2): 1 g via ORAL
  Filled 2018-11-25 (×3): qty 1

## 2018-11-25 MED ORDER — TAMOXIFEN CITRATE 10 MG PO TABS
20.0000 mg | ORAL_TABLET | Freq: Every day | ORAL | Status: DC
  Filled 2018-11-25: qty 2

## 2018-11-25 MED ORDER — HYDROCODONE-ACETAMINOPHEN 5-325 MG PO TABS: 1.0000 | ORAL_TABLET | ORAL | Status: DC | PRN

## 2018-11-25 MED ORDER — ACETAMINOPHEN 650 MG RE SUPP: 650.0000 mg | Freq: Four times a day (QID) | RECTAL | Status: DC | PRN

## 2018-11-25 MED ORDER — DILTIAZEM HCL 25 MG/5ML IV SOLN
10.0000 mg | Freq: Once | INTRAVENOUS | Status: AC
  Administered 2018-11-25: 02:00:00 10 mg via INTRAVENOUS
  Filled 2018-11-25: qty 5

## 2018-11-25 MED ORDER — POLYETHYLENE GLYCOL 3350 17 G PO PACK
17.0000 g | PACK | Freq: Every day | ORAL | Status: DC
  Filled 2018-11-25: qty 1

## 2018-11-25 MED ORDER — ENOXAPARIN SODIUM 40 MG/0.4ML ~~LOC~~ SOLN
40.0000 mg | SUBCUTANEOUS | Status: DC
  Administered 2018-11-25: 06:00:00 40 mg via SUBCUTANEOUS
  Filled 2018-11-25: qty 0.4

## 2018-11-25 MED ORDER — TRAZODONE HCL 50 MG PO TABS
25.0000 mg | ORAL_TABLET | Freq: Every evening | ORAL | Status: DC | PRN
  Administered 2018-11-25: 25 mg via ORAL
  Filled 2018-11-25: qty 1

## 2018-11-25 MED ORDER — VITAMIN D3 50 MCG (2000 UT) PO CAPS: 2000.0000 [IU] | ORAL_CAPSULE | Freq: Every day | ORAL | Status: DC

## 2018-11-25 MED ORDER — LEVOFLOXACIN IN D5W 500 MG/100ML IV SOLN
500.0000 mg | INTRAVENOUS | Status: DC
  Administered 2018-11-25: 07:00:00 500 mg via INTRAVENOUS
  Filled 2018-11-25: qty 100

## 2018-11-25 NOTE — ED Triage Notes (Signed)
Patient presents to Emergency Department via [facility] from HOME with complaints of "heart pounding" pt was woke at 0000.  Pt reports taking her 3rd doxycycline at 2200 for UTI.      History of multiple ABX reactions and A flutter

## 2018-11-25 NOTE — Consult Note (Signed)
[facility] Consultation Note  Patient ID: Brandy Hunt, MRN: [id number], DOB/AGE: [birth year] 85 y.o. Admit date: 11/25/2018   Date of Consult: 11/25/2018 Primary Physician: Ezequiel Kayser, MD Primary Cardiologist: Dr. Wadie Lessen  Chief Complaint:  Chief Complaint  Patient presents with  . Palpitations   Reason for Consult: Atrial fibrillation  HPI: 85 y.o. female with known coronary artery disease status post previous stenting hypertension hyperlipidemia and paroxysmal nonvalvular atrial fibrillation for which she has been doing fairly well at this time with no evidence of anginal symptoms or congestive heart failure worsening symptoms.  The patient has had new onset palpitations irregular heartbeat and was seen in the emergency room.  At that time EKG showed atrial fibrillation with rapid ventricular rate and nonspecific ST changes.  There was no evidence of congestive heart failure or anginal symptoms at that time.  Intravenous diltiazem has controlled her heart rate and currently much improved with symptoms.  Patient currently continues to be in atrial fibrillation.  Past Medical History:  Diagnosis Date  . CAD (coronary artery disease)   . Cancer ([place])   . Glaucoma   . Hypertension   . Keratosis   . MI, old 2002  . Myalgia   . Osteopenia   . Paroxysmal A-fib ([place])   . Raynaud disease       Surgical History:  Past Surgical History:  Procedure Laterality Date  . BREAST SURGERY       Home Meds: Prior to Admission medications   Medication Sig Start Date End Date Taking? Authorizing Provider  amLODipine (NORVASC) 2.5 MG tablet Take 2.5 mg by mouth daily.   Yes [provider]  aspirin 81 MG EC tablet Take 81 mg by mouth daily.    Yes [provider]  Cholecalciferol (VITAMIN D) 2000 UNITS tablet Take 2,000 Units by mouth daily.   Yes [provider]  cyanocobalamin 1000 MCG tablet Take 1,000 mcg by mouth daily.   Yes [provider]  doxycycline (VIBRAMYCIN) 100 MG capsule Take 100 mg by mouth 2 (two) times daily.  11/23/18 12/03/18 Yes [provider]  ezetimibe (ZETIA) 10 MG tablet Take 10 mg by mouth daily.   Yes [provider]  fexofenadine (ALLEGRA) 180 MG tablet Take 180 mg by mouth daily.   Yes [provider]  fluticasone (FLONASE) 50 MCG/ACT nasal spray Place into both nostrils daily.   Yes [provider]  ketoconazole (NIZORAL) 2 % cream Apply 1 application topically daily as needed for irritation.   Yes [provider]  losartan (COZAAR) 50 MG tablet Take 50 mg by mouth daily.   Yes [provider]  magnesium oxide (MAG-OX) 400 MG tablet Take 1 tablet by mouth daily.    Yes [provider]  metoprolol succinate (TOPROL-XL) 50 MG 24 hr tablet Take 50 mg by mouth daily. Take with or immediately following a meal.    Yes [provider]  polyethylene glycol (MIRALAX / GLYCOLAX) packet Take 17 g by mouth daily.   Yes [provider]  nitroGLYCERIN (NITROSTAT) 0.4 MG SL tablet Place 0.4 mg under the tongue every 5 (five) minutes as needed for chest pain.    [provider]    Inpatient Medications:  . apixaban  5 mg Oral BID  . cholecalciferol  2,000 Units Oral Daily  . enoxaparin (LOVENOX) injection  40 mg Subcutaneous Q24H  . fluticasone  2 spray Each Nare Daily  . loratadine  10 mg Oral  Daily  . losartan  50 mg Oral Daily  . metoprolol succinate  100 mg Oral Daily  . omega-3 acid ethyl esters  1 g Oral Daily  . polyethylene glycol  17 g Oral Daily  . senna-docusate  1 tablet Oral Daily  . tamoxifen  20 mg Oral Daily   . sodium chloride 100 mL/hr at 11/25/18 0430  . diltiazem (CARDIZEM) infusion 10 mg/hr (11/25/18 0554)    Allergies:  Allergies  Allergen Reactions  . Erythromycin Shortness Of Breath  . Sulfites Anaphylaxis  . Nitrofurantoin Rash    Petechial rash to shins  . Cephalexin Nausea And  Vomiting  . Ciprofloxacin Other (See Comments)    Blisters on feet  . Niacin And Related   . Penicillins Itching  . Ramipril Cough  . Septra [Sulfamethoxazole-Trimethoprim] Nausea Only  . Statins Other (See Comments)    Joint pain Joint pain  . Griseofulvin Rash  . Macrobid [Nitrofurantoin Monohyd Macro] Rash    Social History   Socioeconomic History  . Marital status: Married    Spouse name: Not on file  . Number of children: Not on file  . Years of education: Not on file  . Highest education level: Not on file  Occupational History  . Not on file  Social Needs  . Financial resource strain: Not on file  . Food insecurity    Worry: Not on file    Inability: Not on file  . Transportation needs    Medical: Not on file    Non-medical: Not on file  Tobacco Use  . Smoking status: Never Smoker  . Smokeless tobacco: Never Used  Substance and Sexual Activity  . Alcohol use: No  . Drug use: Never  . Sexual activity: Not on file  Lifestyle  . Physical activity    Days per week: Not on file    Minutes per session: Not on file  . Stress: Not on file  Relationships  . Social Herbalist on phone: Not on file    Gets together: Not on file    Attends religious service: Not on file    Active member of club or organization: Not on file    Attends meetings of clubs or organizations: Not on file    Relationship status: Not on file  . Intimate partner violence    Fear of current or ex partner: Not on file    Emotionally abused: Not on file    Physically abused: Not on file    Forced sexual activity: Not on file  Other Topics Concern  . Not on file  Social History Narrative  . Not on file     History reviewed. No pertinent family history.   Review of Systems Positive for shortness of breath palpitations Negative for: General:  chills, fever, night sweats or weight changes.  Cardiovascular: PND orthopnea syncope dizziness  Dermatological skin lesions  rashes Respiratory: Cough congestion Urologic: Frequent urination urination at night and hematuria Abdominal: negative for nausea, vomiting, diarrhea, bright red blood per rectum, melena, or hematemesis Neurologic: negative for visual changes, and/or hearing changes  All other systems reviewed and are otherwise negative except as noted above.  Labs: No results for input(s): CKTOTAL, CKMB, TROPONINI in the last 72 hours. Lab Results  Component Value Date   WBC 5.9 11/25/2018   HGB 13.6 11/25/2018   HCT 41.3 11/25/2018   MCV 89.4 11/25/2018   PLT 224 11/25/2018    Recent Labs  Lab  11/25/18 0152 11/25/18 0625  NA 135 139  K 4.0 4.0  CL 100 104  CO2 26 24  BUN 18 15  CREATININE 0.84 0.74  CALCIUM 9.3 9.2  PROT 7.1  --   BILITOT 0.7  --   ALKPHOS 55  --   ALT 14  --   AST 18  --   GLUCOSE 117* 120*   No results found for: CHOL, HDL, LDLCALC, TRIG No results found for: DDIMER  Radiology/Studies:  No results found.  EKG: Atrial fibrillation rapid ventricular rate nonspecific ST changes  Weights: Filed Weights   11/25/18 0146  Weight: 67.1 kg     Physical Exam: Blood pressure (!) 116/52, pulse 87, temperature (!) 97.5 F (36.4 C), temperature source Oral, resp. rate 17, height 5\' 6"  (1.676 m), weight 67.1 kg, SpO2 95 %. Body mass index is 23.89 kg/m. General: Well developed, well nourished, in no acute distress. Head eyes ears nose throat: Normocephalic, atraumatic, sclera non-icteric, no xanthomas, nares are without discharge. No apparent thyromegaly and/or mass  Lungs: Normal respiratory effort.  no wheezes, no rales, no rhonchi.  Heart: Irregular with normal S1 S2. no murmur gallop, no rub, PMI is normal size and placement, carotid upstroke normal without bruit, jugular venous pressure is normal Abdomen: Soft, non-tender, non-distended with normoactive bowel sounds. No hepatomegaly. No rebound/guarding. No obvious abdominal masses. Abdominal aorta is normal size  without bruit Extremities: No edema. no cyanosis, no clubbing, no ulcers  Peripheral : 2+ bilateral upper extremity pulses, 2+ bilateral femoral pulses, 2+ bilateral dorsal pedal pulse Neuro: Alert and oriented. No facial asymmetry. No focal deficit. Moves all extremities spontaneously. Musculoskeletal: Normal muscle tone without kyphosis Psych:  Responds to questions appropriately with a normal affect.    Assessment: 85 year old female with coronary artery disease hypertension hyperlipidemia and new onset atrial fibrillation with rapid ventricular rate without evidence of heart failure or myocardial infarction  Plan: 1.  Continue intravenous diltiazem until increasing dosage of beta-blocker will control heart rate with a goal heart rate between 60 and 90 bpm at rest with possible spontaneous conversion to normal rhythm 2.  Anticoagulation for further risk reduction stroke with atrial fibrillation using Eliquis 5 mg twice per day 3.  No further cardiac diagnostics necessary at this time with patient having no evidence of chest pain or congestive heart failure 4.  If heart rate controlled well with no further symptoms and ambulating well okay for discharged home with follow-up next week for further adjustments of medication management  Signed, Corey Skains M.D. [facility] 11/25/2018, 8:18 AM

## 2018-11-25 NOTE — ED Provider Notes (Signed)
[facility] Emergency Department Provider Note  Time seen: 1:51 AM  I have reviewed the triage vital signs and the nursing notes.   HISTORY  Chief Complaint Palpitations   HPI Brandy Hunt is a 85 y.o. female with a past medical history of CAD, hypertension, prior MI, paroxysmal atrial fibrillation who presents to the emergency department for palpitations.  According to the patient she is currently taking doxycycline (took her third dose) for urinary tract infection.  Patient awoke this morning/late night with palpitations feeling like her heart is racing.  EMS states initial heart rate around 114.  Upon arrival patient's heart rate is 121 bpm.  Patient states her only blood thinner is aspirin.  Denies any chest pain currently or at any point.  Denies any abdominal pain.   Past Medical History:  Diagnosis Date  . CAD (coronary artery disease)   . Cancer ([place])   . Glaucoma   . Hypertension   . Keratosis   . MI, old 2002  . Myalgia   . Osteopenia   . Paroxysmal A-fib ([place])   . Raynaud disease     Patient Active Problem List   Diagnosis Date Noted  . Bilateral hand pain 08/10/2018  . Bilateral hand numbness 08/10/2018  . Impaired glucose tolerance 06/08/2018  . B12 deficiency 06/08/2018  . Weakness of both hands 06/08/2018  . Gait disturbance 03/17/2017  . Hyperkalemia 10/20/2016  . Anxiety 10/20/2015  . Heart palpitations 09/10/2015  . Bilateral carpal tunnel syndrome 05/09/2015  . CRI (chronic renal insufficiency), stage 3 (moderate) 04/18/2015  . High risk medication use 10/14/2014  . Muscle cramps 04/08/2014  . Hypercholesterolemia 04/08/2014  . Coronary artery disease of native artery of native heart with stable angina pectoris ([place]) 04/08/2014  . Benign essential hypertension 09/07/2013  . Seasonal allergic rhinitis 09/07/2013  . Femoral bruit 04/17/2013  . Raynauds phenomenon 03/30/2012  . Myalgia 03/31/2011  . Osteopenia 03/31/2011   . Paroxysmal atrial fibrillation ([place]) 03/31/2011  . Insomnia 08/31/2010  . History of squamous cell carcinoma 08/31/2010  . History of breast cancer in female 08/31/2010  . History of actinic keratosis 08/31/2010  . Glaucoma, closed-angle 08/31/2010  . Chronic constipation 08/31/2010  . Atopic dermatitis 08/31/2010    Past Surgical History:  Procedure Laterality Date  . BREAST SURGERY      Prior to Admission medications   Medication Sig Start Date End Date Taking? Authorizing Provider  aspirin 325 MG tablet Take 325 mg by mouth daily.    [provider]  Cholecalciferol (VITAMIN D) 2000 UNITS tablet Take 2,000 Units by mouth daily.    [provider]  Cholecalciferol (VITAMIN D3) 50 MCG (2000 UT) capsule Take by mouth.    [provider]  clindamycin (CLEOCIN) 300 MG capsule Take 1 capsule (300 mg total) by mouth 3 (three) times daily. 08/12/14   Loney Hering, MD  fexofenadine (ALLEGRA) 180 MG tablet Take 180 mg by mouth daily.    [provider]  fluocinonide (LIDEX) 0.05 % external solution Apply 1 application topically 2 (two) times daily.    [provider]  fluticasone (FLONASE) 50 MCG/ACT nasal spray Place into both nostrils daily.    [provider]  HYDROcodone-acetaminophen (NORCO/VICODIN) 5-325 MG tablet Take 1 tablet by mouth every 6 (six) hours as needed. 12/11/14   Norval Gable, MD  losartan (COZAAR) 50 MG tablet Take 50 mg by mouth daily.    [provider]  Magnesium Oxide 250 MG  TABS Take by mouth.    [provider]  metoprolol succinate (TOPROL-XL) 100 MG 24 hr tablet Take 100 mg by mouth daily. Take with or immediately following a meal.    [provider]  nitroGLYCERIN (NITROSTAT) 0.4 MG SL tablet Place 0.4 mg under the tongue every 5 (five) minutes as needed for chest pain.    [provider]  Omega-3 Fatty Acids (FISH OIL) 1200 MG CAPS Take by mouth.    [provider]  polyethylene glycol (MIRALAX / GLYCOLAX) packet Take 17 g by mouth daily.    [provider]  sennosides-docusate sodium (SENOKOT-S) 8.6-50 MG tablet Take 1 tablet by mouth daily.    [provider]  tamoxifen (NOLVADEX) 20 MG tablet Take 20 mg by mouth daily.    [provider]    Allergies  Allergen Reactions  . Erythromycin Shortness Of Breath  . Sulfites Anaphylaxis  . Nitrofurantoin Rash    Petechial rash to shins  . Cephalexin Nausea And Vomiting  . Ciprofloxacin Other (See Comments)    Blisters on feet  . Niacin And Related   . Penicillins Itching  . Ramipril Cough  . Septra [Sulfamethoxazole-Trimethoprim] Nausea Only  . Statins Other (See Comments)    Joint pain Joint pain  . Griseofulvin Rash  . Macrobid [Nitrofurantoin Monohyd Macro] Rash    History reviewed. No pertinent family history.  Social History Social History   Tobacco Use  . Smoking status: Never Smoker  . Smokeless tobacco: Never Used  Substance Use Topics  . Alcohol use: No  . Drug use: Never    Review of Systems Constitutional: Negative for fever. Cardiovascular: Negative for chest pain.  Positive for palpitations. Respiratory: Negative for shortness of breath. Gastrointestinal: Negative for abdominal pain Musculoskeletal: Negative for musculoskeletal complaints Neurological: Negative for headache All other ROS negative  ____________________________________________   PHYSICAL EXAM:  VITAL SIGNS: ED Triage Vitals [11/25/18 0146]  Enc Vitals Group     BP (!) 174/91     Pulse Rate (!) 121     Resp 19     Temp (!) 97.5 F (36.4 C)     Temp Source Oral     SpO2 98 %     Weight 148 lb (67.1 kg)     Height 5\' 6"  (1.676 m)     Head Circumference      Peak Flow      Pain Score 0     Pain Loc      Pain Edu?      Excl. in [place]?    Constitutional: Alert and oriented. Well appearing and in no distress. Eyes: Normal exam ENT      Head:  Normocephalic and atraumatic.      Mouth/Throat: Mucous membranes are moist. Cardiovascular: Irregular rhythm, rate around 120 bpm. Respiratory: Normal respiratory effort without tachypnea nor retractions. Breath sounds are clear  Gastrointestinal: Soft and nontender. No distention.   Musculoskeletal: Nontender with normal range of motion in all extremities.  Neurologic:  Normal speech and language. No gross focal neurologic deficits  Skin:  Skin is warm, dry and intact.  Psychiatric: Mood and affect are normal.   ____________________________________________    EKG  EKG viewed and interpreted by myself shows either a sinus rhythm versus atrial flutter at 121 bpm.  Patient does have a widened QRS with a normal axis, largely normal intervals, T wave inversions in the inferolateral leads versus flutter waves, regardless appears change since last EKG in  2017  Repeat EKG 2: 44: 21 viewed and interpreted by myself shows atrial flutter at 83 bpm with a widened QRS, normal axis, largely normal intervals again nonspecific ST changes.  ____________________________________________   INITIAL IMPRESSION / ASSESSMENT AND PLAN / ED COURSE  Pertinent labs & imaging results that were available during my care of the patient were reviewed by me and considered in my medical decision making (see chart for details).   Patient presents to the emergency department for palpitations.  Appears to be in atrial flutter at 121 bpm.  We will check labs, dose 10 mg of diltiazem and continue to closely monitor.  Patient agreeable to plan of care.  Differential this time would include atrial flutter, sinus tachycardia, infectious etiology, electrolyte abnormality, ACS.  Patient's labs are largely reassuring including negative troponin.  After 2 rounds of diltiazem the patient's heart rate did briefly slow down below 100 bpm we were able to obtain a repeat EKG confirming atrial flutter.  However shortly after the patient  returned to a rate of 123 bpm once again.  We will placed on a diltiazem infusion and admit to the hospital service for further treatment.  Patient agreeable to plan of care.  Brandy Hunt was evaluated in Emergency Department on 11/25/2018 for the symptoms described in the history of present illness. She was evaluated in the context of the global COVID-19 pandemic, which necessitated consideration that the patient might be at risk for infection with the SARS-CoV-2 virus that causes COVID-19. Institutional protocols and algorithms that pertain to the evaluation of patients at risk for COVID-19 are in a state of rapid change based on information released by regulatory bodies including the CDC and federal and state organizations. These policies and algorithms were followed during the patient's care in the ED.  CRITICAL CARE Performed by: Harvest Dark   Total critical care time: 30 minutes  Critical care time was exclusive of separately billable procedures and treating other patients.  Critical care was necessary to treat or prevent imminent or life-threatening deterioration.  Critical care was time spent personally by me on the following activities: development of treatment plan with patient and/or surrogate as well as nursing, discussions with consultants, evaluation of patient's response to treatment, examination of patient, obtaining history from patient or surrogate, ordering and performing treatments and interventions, ordering and review of laboratory studies, ordering and review of radiographic studies, pulse oximetry and re-evaluation of patient's condition.   ____________________________________________   FINAL CLINICAL IMPRESSION(S) / ED DIAGNOSES  Atrial flutter with rapid ventricular response   Harvest Dark, MD 11/25/18 [phone]

## 2018-11-25 NOTE — H&P (Addendum)
[facility] [place] NAME: Brandy Hunt    MR#:  [id number]  DATE OF BIRTH:  December 17, 1930  DATE OF ADMISSION:  11/25/2018  PRIMARY CARE PHYSICIAN: Ezequiel Kayser, MD   REQUESTING/REFERRING PHYSICIAN: Harvest Dark, MD  CHIEF COMPLAINT:   Chief Complaint  Patient presents with  . Palpitations    HISTORY OF PRESENT ILLNESS:  Brandy Hunt  is a 85 y.o. Caucasian female with a known history of coronary artery disease, hypertension, glaucoma, paroxysmal atrial fibrillation and Raynaud's disease, presented to emergency room with acute onset of palpitations this evening with a heart rate of 120 and elevated blood pressure.  She denied any chest pain or nausea or vomiting or diaphoresis.  No cough or wheezing hemoptysis.  No leg pain or edema no recent travels or surgeries.  She denies any fever or chills.  She has been having dysuria and urinary frequency with occasional hematuria.  Upon presentation to the emergency room, blood pressure was 174/91 with a pulse of 121 and otherwise normal vital signs.  Labs revealed unremarkable CBC and CMP.  Urinalysis showed 11-20 WBCs.  EKG showed atrial flutter with rapid response of 121 with T wave version laterally.  The patient was given 10 mg of IV Cardizem twice.  The patient was still tachycardic with a rate of 123.  She was started on IV Cardizem drip will be admitted to the telemetry bed for further evaluation and management.  PAST MEDICAL HISTORY:   Past Medical History:  Diagnosis Date  . CAD (coronary artery disease)   . Cancer ([place])   . Glaucoma   . Hypertension   . Keratosis   . MI, old 2002  . Myalgia   . Osteopenia   . Paroxysmal A-fib ([place])   . Raynaud disease     PAST SURGICAL HISTORY:   Past Surgical History:  Procedure Laterality Date  . BREAST SURGERY      SOCIAL HISTORY:   Social History   Tobacco Use  . Smoking status: Never Smoker  . Smokeless tobacco:  Never Used  Substance Use Topics  . Alcohol use: No    FAMILY HISTORY:  History reviewed. No pertinent family history.  DRUG ALLERGIES:   Allergies  Allergen Reactions  . Erythromycin Shortness Of Breath  . Sulfites Anaphylaxis  . Nitrofurantoin Rash    Petechial rash to shins  . Cephalexin Nausea And Vomiting  . Ciprofloxacin Other (See Comments)    Blisters on feet  . Niacin And Related   . Penicillins Itching  . Ramipril Cough  . Septra [Sulfamethoxazole-Trimethoprim] Nausea Only  . Statins Other (See Comments)    Joint pain Joint pain  . Griseofulvin Rash  . Macrobid [employer] Macro] Rash    REVIEW OF SYSTEMS:   ROS As per history of present illness. All pertinent systems were reviewed above. Constitutional,  HEENT, cardiovascular, respiratory, GI, GU, musculoskeletal, neuro, psychiatric, endocrine,  integumentary and hematologic systems were reviewed and are otherwise  negative/unremarkable except for positive findings mentioned above in the HPI.   MEDICATIONS AT HOME:   Prior to Admission medications   Medication Sig Start Date End Date Taking? Authorizing Provider  aspirin 325 MG tablet Take 325 mg by mouth daily.    [provider]  Cholecalciferol (VITAMIN D) 2000 UNITS tablet Take 2,000 Units by mouth daily.    [provider]  Cholecalciferol (VITAMIN D3) 50 MCG (2000 UT) capsule Take by mouth.    [provider]  clindamycin (CLEOCIN) 300 MG capsule Take 1 capsule (300 mg total) by mouth 3 (three) times daily. 08/12/14   Loney Hering, MD  fexofenadine (ALLEGRA) 180 MG tablet Take 180 mg by mouth daily.    [provider]  fluocinonide (LIDEX) 0.05 % external solution Apply 1 application topically 2 (two) times daily.    [provider]  fluticasone (FLONASE) 50 MCG/ACT nasal spray Place into both nostrils daily.    [provider]  HYDROcodone-acetaminophen (NORCO/VICODIN) 5-325 MG  tablet Take 1 tablet by mouth every 6 (six) hours as needed. 12/11/14   Norval Gable, MD  losartan (COZAAR) 50 MG tablet Take 50 mg by mouth daily.    [provider]  Magnesium Oxide 250 MG TABS Take by mouth.    [provider]  metoprolol succinate (TOPROL-XL) 100 MG 24 hr tablet Take 100 mg by mouth daily. Take with or immediately following a meal.    [provider]  nitroGLYCERIN (NITROSTAT) 0.4 MG SL tablet Place 0.4 mg under the tongue every 5 (five) minutes as needed for chest pain.    [provider]  Omega-3 Fatty Acids (FISH OIL) 1200 MG CAPS Take by mouth.    [provider]  polyethylene glycol (MIRALAX / GLYCOLAX) packet Take 17 g by mouth daily.    [provider]  sennosides-docusate sodium (SENOKOT-S) 8.6-50 MG tablet Take 1 tablet by mouth daily.    [provider]  tamoxifen (NOLVADEX) 20 MG tablet Take 20 mg by mouth daily.    [provider]      VITAL SIGNS:  Blood pressure (!) 142/80, pulse (!) 123, temperature (!) 97.5 F (36.4 C), temperature source Oral, resp. rate 13, height 5\' 6"  (1.676 m), weight 67.1 kg, SpO2 93 %.  PHYSICAL EXAMINATION:  Physical Exam  GENERAL:  85 y.o.-year-old Caucasian female patient lying in the bed with no acute distress.  EYES: Pupils equal, round, reactive to light and accommodation. No scleral icterus. Extraocular muscles intact.  HEENT: Head atraumatic, normocephalic. Oropharynx and nasopharynx clear.  NECK:  Supple, no jugular venous distention. No thyroid enlargement, no tenderness.  LUNGS: Normal breath sounds bilaterally, no wheezing, rales,rhonchi or crepitation. No use of accessory muscles of respiration.  CARDIOVASCULAR: Tachycardic occasionally irregular rhythm, S1, S2 normal. No murmurs, rubs, or gallops.  ABDOMEN: Soft, nondistended, nontender. Bowel sounds present. No organomegaly or mass.  EXTREMITIES: No pedal edema, cyanosis, or clubbing.   NEUROLOGIC: Cranial nerves II through XII are intact. Muscle strength 5/5 in all extremities. Sensation intact. Gait not checked.  PSYCHIATRIC: The patient is alert and oriented x 3.  Normal affect and good eye contact. SKIN: No obvious rash, lesion, or ulcer.   LABORATORY PANEL:   CBC Recent Labs  Lab 11/25/18 0152  WBC 6.2  HGB 13.9  HCT 42.7  PLT 206   ------------------------------------------------------------------------------------------------------------------  Chemistries  Recent Labs  Lab 11/25/18 0152  NA 135  K 4.0  CL 100  CO2 26  GLUCOSE 117*  BUN 18  CREATININE 0.84  CALCIUM 9.3  AST 18  ALT 14  ALKPHOS 55  BILITOT 0.7   ------------------------------------------------------------------------------------------------------------------  Cardiac Enzymes No results for input(s): TROPONINI in the last 168 hours. ------------------------------------------------------------------------------------------------------------------  RADIOLOGY:  No results found.    IMPRESSION AND PLAN:   1.  Atrial flutter with rapid ventricular response with history of paroxysmal atrial fibrillation.  The patient will be admitted to a telemetry bed.  We will follow serial troponins.  She will be continued on IV Cardizem drip will titrate for heart rate less than 100.  We will still continue Toprol-XL.  Will obtain a cardiology consultation and 2D echo in a.m. we will check a TSH in magnesium level.  I notified Dr. Nehemiah Massed with the patient.  She will be placed on frequent aspirin 325 mg daily.  2.  Possible UTI.  Will obtain urine culture and sensitivity and place the patient on IV Levaquin.  3.  Hypertension.  We will continue amlodipine, Cozaar and Toprol-XL.Marland Kitchen  4.  History of breast cancer.  Tamoxifen will be continued.  5.  Dyslipidemia.  Fish oil and Zetia will be continued.  6.  DVT prophylaxis.  Subcutaneous Lovenox.   All the records are reviewed and case  discussed with ED provider. The plan of care was discussed in details with the patient (and family). I answered all questions. The patient agreed to proceed with the above mentioned plan. Further management will depend upon hospital course.   CODE STATUS: Full code  TOTAL TIME TAKING CARE OF THIS PATIENT: 50 minutes.    Christel Mormon M.D on 11/25/2018 at [place] AM  Pager - [phone]  After 6pm go to [email or web address] - [employer] [facility]  Office  [phone]  CC: Primary care physician; Ezequiel Kayser, MD   Note: This dictation was prepared with Dragon dictation along with smaller phrase technology. Any transcriptional errors that result from this process are unintentional.

## 2018-11-25 NOTE — Care Management Obs Status (Signed)
[facility] NOTIFICATION   Patient Details  Name: AMALIAH STEINBECK MRN: [id number] Date of Birth: 08-27-1930   Medicare Observation Status Notification Given:  Yes    Nakhi Choi A Cassiel Fernandez, RN 11/25/2018, 10:08 AM

## 2018-11-26 DIAGNOSIS — I480 Paroxysmal atrial fibrillation: Secondary | ICD-10-CM | POA: Diagnosis not present

## 2018-11-26 LAB — ECHOCARDIOGRAM COMPLETE
Height: 66 in
Weight: 2411.2 oz

## 2018-11-26 MED ORDER — APIXABAN 5 MG PO TABS: 5.0000 mg | ORAL_TABLET | Freq: Two times a day (BID) | ORAL | 0 refills | Status: AC

## 2018-11-26 MED ORDER — LEVOFLOXACIN 500 MG PO TABS
250.0000 mg | ORAL_TABLET | Freq: Every day | ORAL | Status: DC
  Administered 2018-11-26: 10:00:00 250 mg via ORAL
  Filled 2018-11-26: qty 1

## 2018-11-26 MED ORDER — METOPROLOL SUCCINATE ER 100 MG PO TB24: 100.0000 mg | ORAL_TABLET | Freq: Every day | ORAL | 0 refills | Status: AC

## 2018-11-26 MED ORDER — LEVOFLOXACIN 250 MG PO TABS: 250.0000 mg | ORAL_TABLET | Freq: Every day | ORAL | 0 refills | Status: DC

## 2018-11-26 MED ORDER — APIXABAN 5 MG PO TABS: 5.0000 mg | ORAL_TABLET | Freq: Two times a day (BID) | ORAL | 0 refills | Status: DC

## 2018-11-26 MED ORDER — LOSARTAN POTASSIUM 25 MG PO TABS: 25.0000 mg | ORAL_TABLET | Freq: Every day | ORAL | 0 refills | Status: DC

## 2018-11-26 MED ORDER — METOPROLOL SUCCINATE ER 100 MG PO TB24: 100.0000 mg | ORAL_TABLET | Freq: Every day | ORAL | 0 refills | Status: DC

## 2018-11-26 MED ORDER — LOSARTAN POTASSIUM 25 MG PO TABS: 25.0000 mg | ORAL_TABLET | Freq: Every day | ORAL | 0 refills | Status: AC

## 2018-11-26 MED ORDER — LOSARTAN POTASSIUM 25 MG PO TABS
25.0000 mg | ORAL_TABLET | Freq: Every day | ORAL | Status: DC
  Administered 2018-11-26: 10:00:00 25 mg via ORAL
  Filled 2018-11-26: qty 1

## 2018-11-26 MED ORDER — LEVOFLOXACIN 250 MG PO TABS: 250.0000 mg | ORAL_TABLET | Freq: Every day | ORAL | 0 refills | Status: AC

## 2018-11-26 NOTE — Plan of Care (Signed)
  Problem: Clinical Measurements: Goal: Cardiovascular complication will be avoided Outcome: Progressing   Problem: Activity: Goal: Risk for activity intolerance will decrease Outcome: Progressing   Problem: Education: Goal: Knowledge of disease or condition will improve Outcome: Progressing Goal: Understanding of medication regimen will improve Outcome: Progressing   Problem: Activity: Goal: Ability to tolerate increased activity will improve Outcome: Progressing

## 2018-11-26 NOTE — Discharge Summary (Signed)
[facility] [place] NAME: Brandy Hunt    MR#:  [id number]  DATE OF BIRTH:  07-24-1930  DATE OF ADMISSION:  11/25/2018 ADMITTING PHYSICIAN: Christel Mormon, MD  DATE OF DISCHARGE: 11/26/2018  PRIMARY CARE PHYSICIAN: Ezequiel Kayser, MD    ADMISSION DIAGNOSIS:  Atrial fibrillation with rapid ventricular response ([place]) [I48.91]  DISCHARGE DIAGNOSIS:  Active Problems:   Atrial fibrillation with rapid ventricular response ([place])   SECONDARY DIAGNOSIS:   Past Medical History:  Diagnosis Date  . CAD (coronary artery disease)   . Cancer ([place])   . Glaucoma   . Hypertension   . Keratosis   . MI, old 2002  . Myalgia   . Osteopenia   . Paroxysmal A-fib ([place])   . Raynaud disease     HOSPITAL COURSE:   85 year old very pleasant female with history of PAF, CAD and hypertension who presents to the emergency room due to shortness of breath and palpitations.  1.  Atrial fibrillation with RVR and history of PAF: Patient converted to normal sinus rhythm with medical management.  She was initially on IV Cardizem.  She was followed by cardiology while in the hospital.  She remains in normal sinus rhythm with heart rate controlled.  Beta-blocker was increased to 100 mg daily.  She has been started on Eliquis.  Side effects, alternatives, risk and benefits were thoroughly discussed with the patient and she accepts these risks and agreed to start Eliquis for CVA prevention.  2.  Hypertension: Norvasc has been discontinued.  Cozaar has been decreased to allow increased dose of Toprol.  3.  Urinary tract infection: Patient was on doxycycline prior to admission for UTI.  She was transitioned to oral Levaquin as she was still symptomatic.  4.  History of breast cancer on tamoxifen  5.  Hyperlipidemia: Continue Zetia and fish oil  DISCHARGE CONDITIONS AND DIET:   Stable Cardiac diet  CONSULTS OBTAINED:    DRUG ALLERGIES:   Allergies  Allergen  Reactions  . Erythromycin Shortness Of Breath  . Sulfites Anaphylaxis  . Nitrofurantoin Rash    Petechial rash to shins  . Cephalexin Nausea And Vomiting  . Ciprofloxacin Other (See Comments)    Blisters on feet  . Niacin And Related   . Penicillins Itching  . Ramipril Cough  . Septra [Sulfamethoxazole-Trimethoprim] Nausea Only  . Statins Other (See Comments)    Joint pain Joint pain  . Griseofulvin Rash  . Macrobid [Nitrofurantoin Monohyd Macro] Rash    DISCHARGE MEDICATIONS:   Allergies as of 11/26/2018      Reactions   Erythromycin Shortness Of Breath   Sulfites Anaphylaxis   Nitrofurantoin Rash   Petechial rash to shins   Cephalexin Nausea And Vomiting   Ciprofloxacin Other (See Comments)   Blisters on feet   Niacin And Related    Penicillins Itching   Ramipril Cough   Septra [sulfamethoxazole-trimethoprim] Nausea Only   Statins Other (See Comments)   Joint pain Joint pain   Griseofulvin Rash   Macrobid [nitrofurantoin Monohyd Macro] Rash      Medication List    STOP taking these medications   amLODipine 2.5 MG tablet Commonly known as: NORVASC   aspirin 81 MG EC tablet   doxycycline 100 MG capsule Commonly known as: VIBRAMYCIN     TAKE these medications   apixaban 5 MG Tabs tablet Commonly known as: ELIQUIS Take 1 tablet (5 mg total) by mouth 2 (two) times daily.  cyanocobalamin 1000 MCG tablet Take 1,000 mcg by mouth daily.   ezetimibe 10 MG tablet Commonly known as: ZETIA Take 10 mg by mouth daily.   fexofenadine 180 MG tablet Commonly known as: ALLEGRA Take 180 mg by mouth daily.   fluticasone 50 MCG/ACT nasal spray Commonly known as: FLONASE Place into both nostrils daily.   ketoconazole 2 % cream Commonly known as: NIZORAL Apply 1 application topically daily as needed for irritation.   levofloxacin 250 MG tablet Commonly known as: LEVAQUIN Take 1 tablet (250 mg total) by mouth daily for 2 days.   losartan 25 MG  tablet Commonly known as: COZAAR Take 1 tablet (25 mg total) by mouth daily. What changed:   medication strength  how much to take   magnesium oxide 400 MG tablet Commonly known as: MAG-OX Take 1 tablet by mouth daily.   metoprolol succinate 100 MG 24 hr tablet Commonly known as: TOPROL-XL Take 1 tablet (100 mg total) by mouth daily. Take with or immediately following a meal. What changed:   medication strength  how much to take   nitroGLYCERIN 0.4 MG SL tablet Commonly known as: NITROSTAT Place 0.4 mg under the tongue every 5 (five) minutes as needed for chest pain.   polyethylene glycol 17 g packet Commonly known as: MIRALAX / GLYCOLAX Take 17 g by mouth daily.   Vitamin D 50 MCG (2000 UT) tablet Take 2,000 Units by mouth daily.         Today   CHIEF COMPLAINT:   Patient doing well this morning.  Still has some dysuria no frequency urgency no chest pain or shortness of breath patient has converted to normal sinus rhythm   VITAL SIGNS:  Blood pressure (!) 121/57, pulse 72, temperature 98.1 F (36.7 C), temperature source Oral, resp. rate 19, height 5\' 6"  (1.676 m), weight 69.1 kg, SpO2 92 %.   REVIEW OF SYSTEMS:  Review of Systems  Constitutional: Negative.  Negative for chills, fever and malaise/fatigue.  HENT: Negative.  Negative for ear discharge, ear pain, hearing loss, nosebleeds and sore throat.   Eyes: Negative.  Negative for blurred vision and pain.  Respiratory: Negative.  Negative for cough, hemoptysis, shortness of breath and wheezing.   Cardiovascular: Negative.  Negative for chest pain, palpitations and leg swelling.  Gastrointestinal: Negative.  Negative for abdominal pain, blood in stool, diarrhea, nausea and vomiting.  Genitourinary: Positive for dysuria.  Musculoskeletal: Negative.  Negative for back pain.  Skin: Negative.   Neurological: Negative for dizziness, tremors, speech change, focal weakness, seizures and headaches.   Endo/Heme/Allergies: Negative.  Does not bruise/bleed easily.  Psychiatric/Behavioral: Negative.  Negative for depression, hallucinations and suicidal ideas.     PHYSICAL EXAMINATION:  GENERAL:  85 y.o.-year-old patient lying in the bed with no acute distress.  NECK:  Supple, no jugular venous distention. No thyroid enlargement, no tenderness.  LUNGS: Normal breath sounds bilaterally, no wheezing, rales,rhonchi  No use of accessory muscles of respiration.  CARDIOVASCULAR: S1, S2 normal. No murmurs, rubs, or gallops.  ABDOMEN: Soft, non-tender, non-distended. Bowel sounds present. No organomegaly or mass.  EXTREMITIES: No pedal edema, cyanosis, or clubbing.  PSYCHIATRIC: The patient is alert and oriented x 3.  SKIN: No obvious rash, lesion, or ulcer.   DATA REVIEW:   CBC Recent Labs  Lab 11/25/18 0625  WBC 5.9  HGB 13.6  HCT 41.3  PLT 224    Chemistries  Recent Labs  Lab 11/25/18 0152 11/25/18 0625  NA 135 139  K 4.0 4.0  CL 100 104  CO2 26 24  GLUCOSE 117* 120*  BUN 18 15  CREATININE 0.84 0.74  CALCIUM 9.3 9.2  MG  --  2.1  AST 18  --   ALT 14  --   ALKPHOS 55  --   BILITOT 0.7  --     Cardiac Enzymes No results for input(s): TROPONINI in the last 168 hours.  Microbiology Results  @MICRORSLT48 @  RADIOLOGY:  No results found.    Allergies as of 11/26/2018      Reactions   Erythromycin Shortness Of Breath   Sulfites Anaphylaxis   Nitrofurantoin Rash   Petechial rash to shins   Cephalexin Nausea And Vomiting   Ciprofloxacin Other (See Comments)   Blisters on feet   Niacin And Related    Penicillins Itching   Ramipril Cough   Septra [sulfamethoxazole-trimethoprim] Nausea Only   Statins Other (See Comments)   Joint pain Joint pain   Griseofulvin Rash   Macrobid [nitrofurantoin Monohyd Macro] Rash      Medication List    STOP taking these medications   amLODipine 2.5 MG tablet Commonly known as: NORVASC   aspirin 81 MG EC tablet    doxycycline 100 MG capsule Commonly known as: VIBRAMYCIN     TAKE these medications   apixaban 5 MG Tabs tablet Commonly known as: ELIQUIS Take 1 tablet (5 mg total) by mouth 2 (two) times daily.   cyanocobalamin 1000 MCG tablet Take 1,000 mcg by mouth daily.   ezetimibe 10 MG tablet Commonly known as: ZETIA Take 10 mg by mouth daily.   fexofenadine 180 MG tablet Commonly known as: ALLEGRA Take 180 mg by mouth daily.   fluticasone 50 MCG/ACT nasal spray Commonly known as: FLONASE Place into both nostrils daily.   ketoconazole 2 % cream Commonly known as: NIZORAL Apply 1 application topically daily as needed for irritation.   levofloxacin 250 MG tablet Commonly known as: LEVAQUIN Take 1 tablet (250 mg total) by mouth daily for 2 days.   losartan 25 MG tablet Commonly known as: COZAAR Take 1 tablet (25 mg total) by mouth daily. What changed:   medication strength  how much to take   magnesium oxide 400 MG tablet Commonly known as: MAG-OX Take 1 tablet by mouth daily.   metoprolol succinate 100 MG 24 hr tablet Commonly known as: TOPROL-XL Take 1 tablet (100 mg total) by mouth daily. Take with or immediately following a meal. What changed:   medication strength  how much to take   nitroGLYCERIN 0.4 MG SL tablet Commonly known as: NITROSTAT Place 0.4 mg under the tongue every 5 (five) minutes as needed for chest pain.   polyethylene glycol 17 g packet Commonly known as: MIRALAX / GLYCOLAX Take 17 g by mouth daily.   Vitamin D 50 MCG (2000 UT) tablet Take 2,000 Units by mouth daily.         Management plans discussed with the patient and she is in agreement. Stable for discharge home with [facility]  Patient should follow up with cariology  CODE STATUS:     Code Status Orders  (From admission, onward)         Start     Ordered   11/25/18 0404  Full code  Continuous     11/25/18 0406        Code Status History    This patient has a current  code status but no historical code status.   Advance  Care Planning Activity    Advance Directive Documentation     Most Recent Value  Type of Advance Directive  Healthcare Power of Attorney, Living will  Pre-existing out of facility DNR order (yellow form or pink MOST form)  -  "MOST" Form in Place?  -      TOTAL TIME TAKING CARE OF THIS PATIENT: 38 minutes.    Note: This dictation was prepared with Dragon dictation along with smaller phrase technology. Any transcriptional errors that result from this process are unintentional.  Bettey Costa M.D on 11/26/2018 at 9:23 AM  Between 7am to 6pm - Pager - [phone] After 6pm go to [email or web address] - password [email or web address]  Office  [phone]  CC: Primary care physician; Ezequiel Kayser, MD

## 2018-11-26 NOTE — TOC Transition Note (Signed)
Transition of Care [facility]) - CM/SW Discharge Note   Patient Details  Name: Brandy Hunt MRN: [id number] Date of Birth: 1930-05-14  [facility] CM/SW Contact:  Latanya Maudlin, RN Phone Number: 11/26/2018, 9:48 AM   Clinical Narrative:  Patient to be discharged per MD order. Orders in place for home health services. Patient feels she does not need home health. She reports she is independent with activities of daily living and still drives. Will provide eliquis coupon as she is new to this medication.     Final next level of care: Home w Home Health Services Barriers to Discharge: No Barriers Identified   Patient Goals and CMS Choice        Discharge Placement                       Discharge Plan and Services   Discharge Planning Services: Homebound not met per provider                      HH Arranged: Patient Refused [place]          Social Determinants of Health (SDOH) Interventions     Readmission Risk Interventions No flowsheet data found.

## 2018-11-26 NOTE — Progress Notes (Signed)
[facility] Cardiology [facility] Encounter Note  Patient: Brandy Hunt / Admit Date: 11/25/2018 / Date of Encounter: 11/26/2018, 6:48 AM   Subjective: Patient feeling well today.  No evidence of chest pain or other significant symptoms.  No current evidence of myocardial infarction and or congestive heart failure symptoms with minimal elevation of troponin of 12 Patient has had spontaneous conversion of atrial fibrillation to normal sinus rhythm due to medication management  Review of Systems: Positive for: None Negative for: Vision change, hearing change, syncope, dizziness, nausea, vomiting,diarrhea, bloody stool, stomach pain, cough, congestion, diaphoresis, urinary frequency, urinary pain,skin lesions, skin rashes Others previously listed  Objective: Telemetry: Normal sinus rhythm Physical Exam: Blood pressure (!) 116/55, pulse 67, temperature 98.3 F (36.8 C), resp. rate 16, height 5\' 6"  (1.676 m), weight 69.1 kg, SpO2 96 %. Body mass index is 24.58 kg/m. General: Well developed, well nourished, in no acute distress. Head: Normocephalic, atraumatic, sclera non-icteric, no xanthomas, nares are without discharge. Neck: No apparent masses Lungs: Normal respirations with no wheezes, no rhonchi, no rales , no crackles   Heart: Regular rate and rhythm, normal S1 S2, no murmur, no rub, no gallop, PMI is normal size and placement, carotid upstroke normal without bruit, jugular venous pressure normal Abdomen: Soft, non-tender, non-distended with normoactive bowel sounds. No hepatosplenomegaly. Abdominal aorta is normal size without bruit Extremities: No edema, no clubbing, no cyanosis, no ulcers,  Peripheral: 2+ radial, 2+ femoral, 2+ dorsal pedal pulses Neuro: Alert and oriented. Moves all extremities spontaneously. Psych:  Responds to questions appropriately with a normal affect.   Intake/Output Summary (Last 24 hours) at 11/26/2018 0648 Last data filed at 11/25/2018 1833 Gross  per 24 hour  Intake 720 ml  Output 0 ml  Net 720 ml    Inpatient Medications:  . apixaban  5 mg Oral BID  . cholecalciferol  2,000 Units Oral Daily  . fluticasone  2 spray Each Nare Daily  . loratadine  10 mg Oral Daily  . losartan  50 mg Oral Daily  . metoprolol succinate  100 mg Oral Daily  . omega-3 acid ethyl esters  1 g Oral Daily  . polyethylene glycol  17 g Oral Daily  . senna-docusate  1 tablet Oral Daily   Infusions:  . sodium chloride 100 mL/hr at 11/26/18 0236  . diltiazem (CARDIZEM) infusion 5 mg/hr (11/26/18 0619)    Labs: Recent Labs    11/25/18 0152 11/25/18 0625  NA 135 139  K 4.0 4.0  CL 100 104  CO2 26 24  GLUCOSE 117* 120*  BUN 18 15  CREATININE 0.84 0.74  CALCIUM 9.3 9.2  MG  --  2.1   Recent Labs    11/25/18 0152  AST 18  ALT 14  ALKPHOS 55  BILITOT 0.7  PROT 7.1  ALBUMIN 4.6   Recent Labs    11/25/18 0152 11/25/18 0625  WBC 6.2 5.9  HGB 13.9 13.6  HCT 42.7 41.3  MCV 90.5 89.4  PLT 206 224   No results for input(s): CKTOTAL, CKMB, TROPONINI in the last 72 hours. Invalid input(s): POCBNP No results for input(s): HGBA1C in the last 72 hours.   Weights: Filed Weights   11/25/18 0146 11/25/18 0849 11/26/18 0435  Weight: 67.1 kg 68.4 kg 69.1 kg     Radiology/Studies:  No results found.   Assessment and Recommendation  85 y.o. female with known coronary atherosclerosis hypertension hyperlipidemia on appropriate medication management with paroxysmal nonvalvular atrial fibrillation with rapid ventricular  rate and no current evidence of congestive heart failure or myocardial infarction now spontaneously converted to normal rhythm and hemodynamically stable 1.  Continue metoprolol for heart rate control and maintenance of normal sinus rhythm 2.  Adjustments of medication management for hypertension control including losartan depending on blood pressure and potential side effects 3.  Anticoagulation for further risk reduction in  stroke with atrial fibrillation with Eliquis 4.  High intensity cholesterol therapy for coronary atherosclerosis 5.  Begin ambulation and follow-up for improvements of symptoms and if no further symptoms and remaining in normal rhythm would be okay for discharged home from the cardiac standpoint with follow-up next week for evaluation and treatment and further adjustments 6.  No further cardiac diagnostics necessary at this time  Signed, Serafina Royals M.D. FACC

## 2018-11-27 LAB — URINE CULTURE: Culture: 10000 — AB

## 2019-01-09 ENCOUNTER — Other Ambulatory Visit (HOSPITAL_COMMUNITY): Payer: Self-pay | Admitting: Acute Care

## 2019-01-09 ENCOUNTER — Other Ambulatory Visit: Payer: Self-pay | Admitting: Acute Care

## 2019-01-09 DIAGNOSIS — I639 Cerebral infarction, unspecified: Secondary | ICD-10-CM

## 2019-01-09 DIAGNOSIS — R42 Dizziness and giddiness: Secondary | ICD-10-CM

## 2019-01-20 ENCOUNTER — Ambulatory Visit
Admission: RE | Admit: 2019-01-20 | Discharge: 2019-01-20 | Disposition: A | Discharge status: Home / Self Care | Payer: Medicare Other | Source: Ambulatory Visit | Attending: Acute Care | Admitting: Acute Care

## 2019-01-20 ENCOUNTER — Other Ambulatory Visit: Payer: Self-pay

## 2019-01-20 DIAGNOSIS — I639 Cerebral infarction, unspecified: Secondary | ICD-10-CM | POA: Diagnosis not present

## 2019-01-20 DIAGNOSIS — R42 Dizziness and giddiness: Secondary | ICD-10-CM | POA: Diagnosis present

## 2019-04-30 ENCOUNTER — Ambulatory Visit: Payer: Medicare Other | Admitting: Physical Therapy

## 2019-05-03 ENCOUNTER — Ambulatory Visit: Payer: Medicare Other | Attending: Physician Assistant | Admitting: Physical Therapy

## 2019-05-03 ENCOUNTER — Other Ambulatory Visit: Payer: Self-pay

## 2019-05-03 ENCOUNTER — Encounter: Payer: Self-pay | Admitting: Physical Therapy

## 2019-05-03 DIAGNOSIS — M6281 Muscle weakness (generalized): Secondary | ICD-10-CM

## 2019-05-03 DIAGNOSIS — R269 Unspecified abnormalities of gait and mobility: Secondary | ICD-10-CM | POA: Insufficient documentation

## 2019-05-03 NOTE — Patient Instructions (Signed)
Access Code: [email or web address]: [email or web address]: 04/01/2021Prepared by: Legrand Como SherkExercises  Sit to Stand - 1 x daily - 7 x weekly - 2 sets - 10 reps  Standing March with Counter Support - 1 x daily - 7 x weekly - 1 sets - 20 reps  Standing Hip Abduction with Counter Support - 1 x daily - 7 x weekly - 1 sets - 20 reps  Toe Raises with Unilateral Counter Support - 1 x daily - 7 x weekly - 1 sets - 20 reps  Heel Toe Raises with Unilateral Counter Support - 1 x daily - 7 x weekly - 1 sets - 20 reps  Standing Tandem Balance with Unilateral Counter Support - 1 x daily - 7 x weekly - 1 sets - 5 reps - 20 seconds hold

## 2019-05-05 NOTE — Addendum Note (Signed)
Addended by: Pura Spice on: 05/05/2019 05:52 PM   Modules accepted: Orders

## 2019-05-05 NOTE — Therapy (Signed)
[facility] [address] [place], [place], [address] Phone: [phone]   Fax:  [phone]  Physical Therapy Evaluation  Patient Details  Name: Brandy Hunt MRN: [id number] Date of Birth: January 30, 1931 Referring Provider (PT): Brandy Hunt, [place]   Encounter Date: 05/03/2019  PT End of Session - 05/05/19 1712    Visit Number  1    Number of Visits  9    Date for PT Re-Evaluation  05/31/19    Authorization - Visit Number  1    Authorization - Number of Visits  10    PT Start Time  0811    PT Stop Time  0905    PT Time Calculation (min)  54 min    Equipment Utilized During Treatment  Gait belt    Activity Tolerance  Patient tolerated treatment well    Behavior During Therapy  [facility] for tasks assessed/performed       Past Medical History:  Diagnosis Date  . CAD (coronary artery disease)   . Cancer ([place])   . Glaucoma   . Hypertension   . Keratosis   . MI, old 2002  . Myalgia   . Osteopenia   . Paroxysmal A-fib ([place])   . Raynaud disease     Past Surgical History:  Procedure Laterality Date  . BREAST SURGERY      There were no vitals filed for this visit.   Subjective Assessment - 05/05/19 1703    Subjective  [name] occasional dizziness and staggering while walking.  Pt. reports no pain and states she was participating with aquatic ex. program 2x/week at [employer] prior to [employer].  Pt. walks on a consistent basis.  No c/o double vision or dizziness at this time.    Pertinent History  Pt. widowed and lives alone.  Pt. has 2 kids (son/ daughter) and 3 grandkids.  [name]    Patient Stated Goals  Improve LE strength/ balance with walking.    Currently in Pain?  No/denies         [facility] PT Assessment - 05/05/19 0001      Assessment   Medical Diagnosis  Multisensory dizziness/ difficulty walking    Referring Provider (PT)  Brandy Dy, PA-C    Onset Date/Surgical Date  02/02/19    Next MD Visit   1 year    Prior Therapy  No      Precautions   Precautions  [place] residence      Prior Function   Level of Independence  Independent       Supine SLR 10x L/R (no issues) B LE muscle strength 5/5 MMT except hip flexion/ ankle PF/IV/EV 4/5 MMT Berg 50/56. No dizziness during tx. 1 episode of staggering gait to R while descending stairs.  STS with no UE assist.     Objective measurements completed on examination: See above findings.     See HEP Nustep L4 10 min. B UE/LE (consistent cadence)- good endurance    PT Education - 05/05/19 1711    Education Details  See HEP    Person(s) Educated  Patient    Methods  Explanation;Demonstration;Handout    Comprehension  Verbalized understanding;Returned demonstration          PT Long Term Goals - 05/05/19 1738      PT LONG TERM GOAL #1   Title  Pt. will increase FOTO to  76 to improve functional mobility/ independence.    Baseline  Initial FOTO: 73    Time  4    Period  Weeks    Status  New    Target Date  05/31/19      PT LONG TERM GOAL #2   Title  Pt. will increase Berg balance test to >53 out of 56 to improve balance/ decrease fall risk.    Baseline  Initial Berg: 50/56    Time  4    Period  Weeks    Status  New    Target Date  05/31/19      PT LONG TERM GOAL #3   Title  Pt. independent with HEP to increase B hip/ankle strength 1/2 muscle grade to improve gait/ functional mobility.    Baseline  B LE muscle strength grossly 5/5 MMT except hip flexion/ ankle 4/5 MMT.    Time  4    Period  Weeks    Status  New    Target Date  05/31/19      PT LONG TERM GOAL #4   Title  Pt. able to walk 2 miles with no c/o dizziness/ loss of balance to improve safety/ functional mobility/ independence.    Baseline  Pt. reports occasional staggering gait/ dizziness with increase activity.    Time  4    Period  Weeks    Status  New    Target Date  05/31/19              Plan - 05/05/19 1714    Clinical Impression Statement  Pt. is a pleasant 86 y/o female with c/o dizziness and balance issues.  Pt. reports no falls recently but occasional staggering gait and difficulty descending stairs.  B UE/LE AROM WFL.  B LE muscle strength grossly 5/5 MMT except hip flexion 4/5 MMT and ankle PF/IV/EV 4/5 MMT.  Grip strength: L 36.2#/ R 49.2#.  Pt. able to STS with no UE assist.  Berg balance test: 50/56. FOTO: initial 73/ goal 76.  Pt. ambulates with consistent recip. gait pattern with limited arm swing and occaional side step/ staggering, esp. when turning or descending curbs/ stairs.  No LOB during tx. session.  No c/o dizziness t/o evaluation.  Pt. will benefit from skilled PT services to increase B hip/ ankle strengthening and balance to improve gait.    Stability/Clinical Decision Making  Stable/Uncomplicated    Clinical Decision Making  Low    Rehab Potential  Good    PT Frequency  2x / week    PT Duration  4 weeks    PT Treatment/Interventions  ADLs/Self Care Home Management;Aquatic Therapy;Stair training;Functional mobility training;Neuromuscular re-education;Balance training;Gait training;Therapeutic exercise;Therapeutic activities;Patient/family education;Manual techniques;Passive range of motion    PT Next Visit Plan  Dynamic balance    PT Home Exercise Plan  [id number]       Patient will benefit from skilled therapeutic intervention in order to improve the following deficits and impairments:  Abnormal gait, Decreased balance, Decreased endurance, Decreased mobility, Difficulty walking, Improper body mechanics, Decreased activity tolerance, Decreased strength, Postural dysfunction  Visit Diagnosis: Gait disturbance  Muscle weakness (generalized)     Problem List Patient Active Problem List   Diagnosis Date Noted  . Atrial flutter with rapid ventricular response ([place]) 11/25/2018  . Bilateral hand pain 08/10/2018  . Bilateral hand numbness  08/10/2018  . Impaired glucose tolerance 06/08/2018  . B12 deficiency 06/08/2018  . Weakness of both hands 06/08/2018  . Gait disturbance 03/17/2017  .  Hyperkalemia 10/20/2016  . Anxiety 10/20/2015  . Heart palpitations 09/10/2015  . Bilateral carpal tunnel syndrome 05/09/2015  . CRI (chronic renal insufficiency), stage 3 (moderate) 04/18/2015  . High risk medication use 10/14/2014  . Muscle cramps 04/08/2014  . Hypercholesterolemia 04/08/2014  . Coronary artery disease of native artery of native heart with stable angina pectoris ([place]) 04/08/2014  . Benign essential hypertension 09/07/2013  . Seasonal allergic rhinitis 09/07/2013  . Femoral bruit 04/17/2013  . Raynauds phenomenon 03/30/2012  . Myalgia 03/31/2011  . Osteopenia 03/31/2011  . Paroxysmal atrial fibrillation ([place]) 03/31/2011  . Insomnia 08/31/2010  . History of squamous cell carcinoma 08/31/2010  . History of breast cancer in female 08/31/2010  . History of actinic keratosis 08/31/2010  . Glaucoma, closed-angle 08/31/2010  . Chronic constipation 08/31/2010  . Atopic dermatitis 08/31/2010   Pura Spice, PT, DPT # [phone] 05/05/2019, 5:47 PM  [facility] [address] [place], [place], [address] Phone: [phone]   Fax:  [phone]  Name: Brandy Hunt MRN: [id number] Date of Birth: 02/18/1930

## 2019-05-07 ENCOUNTER — Ambulatory Visit: Payer: Medicare Other | Admitting: Physical Therapy

## 2019-05-07 ENCOUNTER — Encounter: Payer: Self-pay | Admitting: Physical Therapy

## 2019-05-07 ENCOUNTER — Other Ambulatory Visit: Payer: Self-pay

## 2019-05-07 VITALS — BP 171/50

## 2019-05-07 DIAGNOSIS — R269 Unspecified abnormalities of gait and mobility: Secondary | ICD-10-CM

## 2019-05-07 DIAGNOSIS — M6281 Muscle weakness (generalized): Secondary | ICD-10-CM

## 2019-05-07 NOTE — Therapy (Signed)
[facility] [address] [place], [place], [address] Phone: [phone]   Fax:  [phone]  Physical Therapy Treatment  Patient Details  Name: Brandy Hunt MRN: [id number] Date of Birth: 09/07/1930 Referring Provider (PT): Levada Dy, [place]   Encounter Date: 05/07/2019  PT End of Session - 05/07/19 0824    Visit Number  2    Number of Visits  9    Date for PT Re-Evaluation  05/31/19    Authorization - Visit Number  2    Authorization - Number of Visits  10    PT Start Time  0815    PT Stop Time  0910    PT Time Calculation (min)  55 min    Equipment Utilized During Treatment  Gait belt    Activity Tolerance  Patient tolerated treatment well    Behavior During Therapy  [facility] for tasks assessed/performed       Past Medical History:  Diagnosis Date  . CAD (coronary artery disease)   . Cancer ([place])   . Glaucoma   . Hypertension   . Keratosis   . MI, old 2002  . Myalgia   . Osteopenia   . Paroxysmal A-fib ([place])   . Raynaud disease     Past Surgical History:  Procedure Laterality Date  . BREAST SURGERY      Vitals:   05/07/19 0833  BP: (!) 171/50    Subjective Assessment - 05/07/19 [id number]    Subjective  Patient presents to clinic with no significant changes since evaluation. Patient notes she feels her blood pressure is high this morning despite taking blood pressure medication as prescirbed this morning. Patient notes no falls. Patient is excited for her trip to [place] with departure set for Thursday.    Pertinent History  Pt. widowed and lives alone.  Pt. has 2 kids (son/ daughter) and 3 grandkids.  Goes by [facility]    Patient Stated Goals  Improve LE strength/ balance with walking.    Currently in Pain?  No/denies      TREATMENT  Neuromuscular Re-education: [place] administered per evaluating clinician request: 22/100  Yes answers: E2 Because of your problem, do you feel frustrated?    P11. Do quick head  movements increase your problem?    E15. Because of your problem, are you afraid people may think you're intoxicated?  Patient instructed in advanced balance exercise (SBA/CGA) Gait outside in hallway: Forward walking with head turns up/down, side/side 3x80 feet each, VCs for pace and control throughout  Standing in parallel bars: Tandem walking with 1 fingertip support, CGA/SBA. VCs for improved trunk control and maintained balance  Patient educated on balance systems (visual, vestibular, and somatosensory) and strategies for improved balance control during activity: pause/slow; return to center.   Therapeutic Exercise: Nustep, seat 8, L4-5, x10 min for cardiovascular conditioning and improved BLE strength 2x15 Heel raises for increased neural drive and improved proprioception of forefoot x30 RTB, seated clamshell for improved pelvic control and hip strength   Patient educated throughout session on appropriate technique and form using multi-modal cueing, HEP, and activity modification. Patient articulated understanding and returned demonstration.  ASSESSMENT Patient presents to clinic with excellent motivation to participate in therapy. Patient demonstrates deficits in dynamic balance and BLE strength. Patient able to achieve appropriate use of balance strategies (pause/slow and return to center) when off balance during today's session and responded positively to educational interventions. Patient will benefit from continued skilled therapeutic intervention  to address remaining deficits in balance and BLE strength in order to decrease risk of falls, increase function, and improve overall QOL.   PT Long Term Goals - 05/05/19 1738      PT LONG TERM GOAL #1   Title  Pt. will increase FOTO to 76 to improve functional mobility/ independence.    Baseline  Initial FOTO: 73    Time  4    Period  Weeks    Status  New    Target Date  05/31/19      PT LONG TERM GOAL #2   Title  Pt. will  increase Berg balance test to >53 out of 56 to improve balance/ decrease fall risk.    Baseline  Initial Berg: 50/56    Time  4    Period  Weeks    Status  New    Target Date  05/31/19      PT LONG TERM GOAL #3   Title  Pt. independent with HEP to increase B hip/ankle strength 1/2 muscle grade to improve gait/ functional mobility.    Baseline  B LE muscle strength grossly 5/5 MMT except hip flexion/ ankle 4/5 MMT.    Time  4    Period  Weeks    Status  New    Target Date  05/31/19      PT LONG TERM GOAL #4   Title  Pt. able to walk 2 miles with no c/o dizziness/ loss of balance to improve safety/ functional mobility/ independence.    Baseline  Pt. reports occasional staggering gait/ dizziness with increase activity.    Time  4    Period  Weeks    Status  New    Target Date  05/31/19            Plan - 05/07/19 0825    Clinical Impression Statement  Patient presents to clinic with excellent motivation to participate in therapy. Patient demonstrates deficits in dynamic balance and BLE strength. Patient able to achieve appropriate use of balance strategies (pause/slow and return to center) when off balance during today's session and responded positively to educational interventions. Patient will benefit from continued skilled therapeutic intervention to address remaining deficits in balance and BLE strength in order to decrease risk of falls, increase function, and improve overall QOL.    Stability/Clinical Decision Making  Stable/Uncomplicated    Rehab Potential  Good    PT Frequency  2x / week    PT Duration  4 weeks    PT Treatment/Interventions  ADLs/Self Care Home Management;Aquatic Therapy;Stair training;Functional mobility training;Neuromuscular re-education;Balance training;Gait training;Therapeutic exercise;Therapeutic activities;Patient/family education;Manual techniques;Passive range of motion    PT Next Visit Plan  Dynamic balance    PT Home Exercise Plan  [id number]        Patient will benefit from skilled therapeutic intervention in order to improve the following deficits and impairments:  Abnormal gait, Decreased balance, Decreased endurance, Decreased mobility, Difficulty walking, Improper body mechanics, Decreased activity tolerance, Decreased strength, Postural dysfunction  Visit Diagnosis: Gait disturbance  Muscle weakness (generalized)     Problem List Patient Active Problem List   Diagnosis Date Noted  . Atrial flutter with rapid ventricular response ([place]) 11/25/2018  . Bilateral hand pain 08/10/2018  . Bilateral hand numbness 08/10/2018  . Impaired glucose tolerance 06/08/2018  . B12 deficiency 06/08/2018  . Weakness of both hands 06/08/2018  . Gait disturbance 03/17/2017  . Hyperkalemia 10/20/2016  . Anxiety 10/20/2015  . Heart palpitations 09/10/2015  .  Bilateral carpal tunnel syndrome 05/09/2015  . CRI (chronic renal insufficiency), stage 3 (moderate) 04/18/2015  . High risk medication use 10/14/2014  . Muscle cramps 04/08/2014  . Hypercholesterolemia 04/08/2014  . Coronary artery disease of native artery of native heart with stable angina pectoris ([place]) 04/08/2014  . Benign essential hypertension 09/07/2013  . Seasonal allergic rhinitis 09/07/2013  . Femoral bruit 04/17/2013  . Raynauds phenomenon 03/30/2012  . Myalgia 03/31/2011  . Osteopenia 03/31/2011  . Paroxysmal atrial fibrillation ([place]) 03/31/2011  . Insomnia 08/31/2010  . History of squamous cell carcinoma 08/31/2010  . History of breast cancer in female 08/31/2010  . History of actinic keratosis 08/31/2010  . Glaucoma, closed-angle 08/31/2010  . Chronic constipation 08/31/2010  . Atopic dermatitis 08/31/2010   Myles Gip PT, DPT [phone] 05/07/2019, 9:17 AM  [facility] [address] [place], [place], [address] Phone: [phone]   Fax:  [phone]  Name: Brandy Hunt MRN: [id number] Date of Birth:  [birth year]

## 2019-05-09 ENCOUNTER — Ambulatory Visit: Payer: Medicare Other | Admitting: Physical Therapy

## 2019-05-09 ENCOUNTER — Encounter: Payer: Self-pay | Admitting: Physical Therapy

## 2019-05-09 ENCOUNTER — Other Ambulatory Visit: Payer: Self-pay

## 2019-05-09 DIAGNOSIS — M6281 Muscle weakness (generalized): Secondary | ICD-10-CM

## 2019-05-09 DIAGNOSIS — R269 Unspecified abnormalities of gait and mobility: Secondary | ICD-10-CM | POA: Diagnosis not present

## 2019-05-09 NOTE — Therapy (Signed)
[facility] [address] [place], [place], [address] Phone: [phone]   Fax:  [phone]  Physical Therapy Treatment  Patient Details  Name: Brandy Hunt MRN: [id number] Date of Birth: July 27, 1930 Referring Provider (PT): Levada Dy, [place]   Encounter Date: 05/09/2019  PT End of Session - 05/09/19 0758    Visit Number  3    Number of Visits  9    Date for PT Re-Evaluation  05/31/19    Authorization - Visit Number  3    Authorization - Number of Visits  10    PT Start Time  0814    PT Stop Time  0904    PT Time Calculation (min)  50 min    Equipment Utilized During Treatment  Gait belt    Activity Tolerance  Patient tolerated treatment well    Behavior During Therapy  [facility] for tasks assessed/performed       Past Medical History:  Diagnosis Date  . CAD (coronary artery disease)   . Cancer ([place])   . Glaucoma   . Hypertension   . Keratosis   . MI, old 2002  . Myalgia   . Osteopenia   . Paroxysmal A-fib ([place])   . Raynaud disease     Past Surgical History:  Procedure Laterality Date  . BREAST SURGERY      There were no vitals filed for this visit.  Subjective Assessment - 05/09/19 0758    Subjective  Pt. reports no dizziness and feels she is getting better.  Pt. is excited to go to the beach tomorrow to stay with daughter.    Pertinent History  Pt. widowed and lives alone.  Pt. has 2 kids (son/ daughter) and 3 grandkids.  Goes by [facility]    Patient Stated Goals  Improve LE strength/ balance with walking.    Currently in Pain?  No/denies        BP: 142/48 HR: 64 bpm     TREATMENT  Therapeutic Exercise:  Nustep, seat 8, L4-5, x10 min for cardiovascular conditioning and improved BLE strength Standing hip flexion/ abduction/ heel raises 30x (LE strengthening/ cuing for upright posture).  Discussed and reviewed HEP.   Neuromuscular Re-education:  Walking in hallway with added head turns/ alt. UE  and LE touches/ turning 3x  Tandem walking with 1 fingertip support, CGA/SBA. VCs for improved trunk control and maintained balance Walking cone taps outside of //-bars with no UE assist working on ankle control Walking on blue mat with unlevel surfaces with SBA for safety/ cuing  (improved ankle control). Walking outside on varying terrain/ parking lot/ ascending and descending stairs with recip. Pattern and no handrail (simulate beach stairs).     Patient educated on balance systems (visual, vestibular, and somatosensory) and strategies for improved balance control during activity: pause/slow; return to center.  Patient educated throughout session on appropriate technique and form using multi-modal cueing, HEP, and activity modification. Patient articulated understanding and returned demonstration.  ASSESSMENT Patient presents to clinic with excellent motivation to participate in therapy. Patient demonstrates deficits in dynamic balance and BLE strength. Patient able to achieve appropriate use of balance strategies (pause/slow and return to center) when off balance during today's session and responded positively to educational interventions. Patient will benefit from continued skilled therapeutic intervention to address remaining deficits in balance and BLE strength in order to decrease risk of falls, increase function, and improve overall QOL.     PT Long Term Goals -  05/05/19 1738      PT LONG TERM GOAL #1   Title  Pt. will increase FOTO to 76 to improve functional mobility/ independence.    Baseline  Initial FOTO: 73    Time  4    Period  Weeks    Status  New    Target Date  05/31/19      PT LONG TERM GOAL #2   Title  Pt. will increase Berg balance test to >53 out of 56 to improve balance/ decrease fall risk.    Baseline  Initial Berg: 50/56    Time  4    Period  Weeks    Status  New    Target Date  05/31/19      PT LONG TERM GOAL #3   Title  Pt. independent with HEP to  increase B hip/ankle strength 1/2 muscle grade to improve gait/ functional mobility.    Baseline  B LE muscle strength grossly 5/5 MMT except hip flexion/ ankle 4/5 MMT.    Time  4    Period  Weeks    Status  New    Target Date  05/31/19      PT LONG TERM GOAL #4   Title  Pt. able to walk 2 miles with no c/o dizziness/ loss of balance to improve safety/ functional mobility/ independence.    Baseline  Pt. reports occasional staggering gait/ dizziness with increase activity.    Time  4    Period  Weeks    Status  New    Target Date  05/31/19            Plan - 05/09/19 0758    Clinical Impression Statement  Pt. had 3 episodes of staggering gait during hallway walking with added head turns/ high marching with alt. UE/ LE touches.  Pt. able to self-correct independently when staggering to R or L without UE assist in hallway.  Pt. instructed to use a walking stick while walking on beach next week.  Pt. will focus on hip/ ankle strengthening ex. program while at beach and scheduled to return to PT in 2 weeks.  Pt. instructed to call PT if any questions or concerns while at the beach.    Stability/Clinical Decision Making  Stable/Uncomplicated    Clinical Decision Making  Low    Rehab Potential  Good    PT Frequency  2x / week    PT Duration  4 weeks    PT Treatment/Interventions  ADLs/Self Care Home Management;Aquatic Therapy;Stair training;Functional mobility training;Neuromuscular re-education;Balance training;Gait training;Therapeutic exercise;Therapeutic activities;Patient/family education;Manual techniques;Passive range of motion    PT Next Visit Plan  Dynamic balance.  Discuss trip to [place]       Patient will benefit from skilled therapeutic intervention in order to improve the following deficits and impairments:  Abnormal gait, Decreased balance, Decreased endurance, Decreased mobility, Difficulty walking, Improper body mechanics, Decreased  activity tolerance, Decreased strength, Postural dysfunction  Visit Diagnosis: Gait disturbance  Muscle weakness (generalized)     Problem List Patient Active Problem List   Diagnosis Date Noted  . Atrial flutter with rapid ventricular response ([place]) 11/25/2018  . Bilateral hand pain 08/10/2018  . Bilateral hand numbness 08/10/2018  . Impaired glucose tolerance 06/08/2018  . B12 deficiency 06/08/2018  . Weakness of both hands 06/08/2018  . Gait disturbance 03/17/2017  . Hyperkalemia 10/20/2016  . Anxiety 10/20/2015  . Heart palpitations 09/10/2015  . Bilateral carpal  tunnel syndrome 05/09/2015  . CRI (chronic renal insufficiency), stage 3 (moderate) 04/18/2015  . High risk medication use 10/14/2014  . Muscle cramps 04/08/2014  . Hypercholesterolemia 04/08/2014  . Coronary artery disease of native artery of native heart with stable angina pectoris ([place]) 04/08/2014  . Benign essential hypertension 09/07/2013  . Seasonal allergic rhinitis 09/07/2013  . Femoral bruit 04/17/2013  . Raynauds phenomenon 03/30/2012  . Myalgia 03/31/2011  . Osteopenia 03/31/2011  . Paroxysmal atrial fibrillation ([place]) 03/31/2011  . Insomnia 08/31/2010  . History of squamous cell carcinoma 08/31/2010  . History of breast cancer in female 08/31/2010  . History of actinic keratosis 08/31/2010  . Glaucoma, closed-angle 08/31/2010  . Chronic constipation 08/31/2010  . Atopic dermatitis 08/31/2010   Pura Spice, PT, DPT # [id number] 05/09/2019, 6:33 PM  [place] [facility] [address] [place], [place], [address] Phone: [phone]   Fax:  [phone]  Name: Brandy Hunt MRN: [id number] Date of Birth: [birth year]

## 2019-06-01 ENCOUNTER — Ambulatory Visit: Payer: Medicare Other | Admitting: Physical Therapy

## 2019-06-01 ENCOUNTER — Other Ambulatory Visit: Payer: Self-pay

## 2019-06-01 DIAGNOSIS — R269 Unspecified abnormalities of gait and mobility: Secondary | ICD-10-CM | POA: Diagnosis not present

## 2019-06-01 DIAGNOSIS — M6281 Muscle weakness (generalized): Secondary | ICD-10-CM

## 2019-06-01 NOTE — Therapy (Addendum)
[place] [facility] [address] [place], [place], [address] Phone: [phone]   Fax:  [phone]  Physical Therapy Treatment  Patient Details  Name: Brandy Hunt MRN: [id number] Date of Birth: Jun 30, 1930 Referring Provider (PT): Levada Dy, [place]   Encounter Date: 06/01/2019   PT End of Session - 06/01/19 1529    Visit Number  4    Number of Visits  12    Date for PT Re-Evaluation  06/29/19    Authorization - Visit Number  1    Authorization - Number of Visits  10    PT Start Time  0901    PT Stop Time  0948    PT Time Calculation (min)  47 min    Equipment Utilized During Treatment  Gait belt    Activity Tolerance  Patient tolerated treatment well    Behavior During Therapy  [facility] for tasks assessed/performed         Past Medical History:  Diagnosis Date  . CAD (coronary artery disease)   . Cancer ([place])   . Glaucoma   . Hypertension   . Keratosis   . MI, old 2002  . Myalgia   . Osteopenia   . Paroxysmal A-fib ([place])   . Raynaud disease     Past Surgical History:  Procedure Laterality Date  . BREAST SURGERY      There were no vitals filed for this visit.  Subjective Assessment - 06/05/19 0738    Subjective  Pt. states she had a good time at the beach.  Pt. reports not walking on beach/ sand due to weather.  No falls reported.    Pertinent History  Pt. widowed and lives alone.  Pt. has 2 kids (son/ daughter) and 3 grandkids.  Goes by [facility]    Patient Stated Goals  Improve LE strength/ balance with walking.    Currently in Pain?  No/denies         [facility] PT Assessment - 06/05/19 0001      Assessment   Medical Diagnosis  Multisensory dizziness/ difficulty walking    Referring Provider (PT)  Levada Dy, PA-C    Onset Date/Surgical Date  02/02/19      Prior Function   Level of Independence  Independent        TREATMENT  Therapeutic Exercise:  Nustep, seat 8, L5, x10 min for cardiovascular  conditioning and improved BLE strength Standing (4#) LE ex.:  hip flexion/ abduction/ extension/ knee flexion/ heel raises 30x (LE strengthening/ cuing for upright posture).   Neuromuscular Re-education:  Walking in hallway with added head turns/ alt. UE and LE touches/ turning 4x. STS 10x with no UE assist (good mechanics/ mirror feedback for posture).  Tandem walking with 1 fingertip support, CGA/SBA. VCs for improved trunk control and maintained balance Walking cone taps outside of //-bars with no UE assist working on ankle control Airex step ups/ overs/ functional reaching. Walking outside on varying terrain/ parking lot/ ascending and descending stairs with recip. Pattern and no handrail.    PT Long Term Goals - 06/05/19 0750      PT LONG TERM GOAL #1   Title  Pt. will increase FOTO to 76 to improve functional mobility/ independence.    Baseline  Initial FOTO: 73    Time  4    Period  Weeks    Status  On-going    Target Date  06/29/19      PT LONG TERM GOAL #2  Title  Pt. will increase Berg balance test to >53 out of 56 to improve balance/ decrease fall risk.    Baseline  Initial Berg: 50/56    Time  4    Period  Weeks    Status  On-going    Target Date  06/29/19      PT LONG TERM GOAL #3   Title  Pt. independent with HEP to increase B hip/ankle strength 1/2 muscle grade to improve gait/ functional mobility.    Baseline  B LE muscle strength grossly 5/5 MMT except hip flexion/ ankle 4/5 MMT.    Time  4    Period  Weeks    Status  Partially Met    Target Date  06/29/19      PT LONG TERM GOAL #4   Title  Pt. able to walk 2 miles with no c/o dizziness/ loss of balance to improve safety/ functional mobility/ independence.    Baseline  Pt. reports occasional staggering gait/ dizziness with increase activity.    Time  4    Period  Weeks    Status  Partially Met    Target Date  06/29/19            Plan - 06/05/19 0747    Clinical Impression Statement  Pt. did  well with progression of LE ther.ex./ resisted ex.  Pt. demonstrates improved ability to self-correct with any LOB or staggering while walking/ performing dynamic balance tasks in hallway.  B ankle/ knee muscle weakness noted during higher level balance tasks.  Pt. will continue to benefit from skilled PT services to focus on LE strengthening/ independence with walking/ decrease fall risk.    Stability/Clinical Decision Making  Stable/Uncomplicated    Clinical Decision Making  Low    Rehab Potential  Good    PT Frequency  2x / week    PT Duration  4 weeks    PT Treatment/Interventions  ADLs/Self Care Home Management;Aquatic Therapy;Stair training;Functional mobility training;Neuromuscular re-education;Balance training;Gait training;Therapeutic exercise;Therapeutic activities;Patient/family education;Manual techniques;Passive range of motion    PT Next Visit Plan  Dynamic balance.  Ankle/knee strengthening progression.    PT Home Exercise Plan  [phone]       Patient will benefit from skilled therapeutic intervention in order to improve the following deficits and impairments:  Abnormal gait, Decreased balance, Decreased endurance, Decreased mobility, Difficulty walking, Improper body mechanics, Decreased activity tolerance, Decreased strength, Postural dysfunction  Visit Diagnosis: Gait disturbance  Muscle weakness (generalized)     Problem List Patient Active Problem List   Diagnosis Date Noted  . Atrial flutter with rapid ventricular response ([place]) 11/25/2018  . Bilateral hand pain 08/10/2018  . Bilateral hand numbness 08/10/2018  . Impaired glucose tolerance 06/08/2018  . B12 deficiency 06/08/2018  . Weakness of both hands 06/08/2018  . Gait disturbance 03/17/2017  . Hyperkalemia 10/20/2016  . Anxiety 10/20/2015  . Heart palpitations 09/10/2015  . Bilateral carpal tunnel syndrome 05/09/2015  . CRI (chronic renal insufficiency), stage 3 (moderate) 04/18/2015  . High risk  medication use 10/14/2014  . Muscle cramps 04/08/2014  . Hypercholesterolemia 04/08/2014  . Coronary artery disease of native artery of native heart with stable angina pectoris ([place]) 04/08/2014  . Benign essential hypertension 09/07/2013  . Seasonal allergic rhinitis 09/07/2013  . Femoral bruit 04/17/2013  . Raynauds phenomenon 03/30/2012  . Myalgia 03/31/2011  . Osteopenia 03/31/2011  . Paroxysmal atrial fibrillation ([place]) 03/31/2011  . Insomnia 08/31/2010  . History of squamous cell carcinoma 08/31/2010  .  History of breast cancer in female 08/31/2010  . History of actinic keratosis 08/31/2010  . Glaucoma, closed-angle 08/31/2010  . Chronic constipation 08/31/2010  . Atopic dermatitis 08/31/2010   Pura Spice, PT, DPT # [phone] 06/05/2019, 7:51 AM  [place] [facility] [address] [place], [place], [address] Phone: [phone]   Fax:  [phone]  Name: Brandy Hunt MRN: [id number] Date of Birth: 1930-10-01

## 2019-06-05 ENCOUNTER — Encounter: Payer: Self-pay | Admitting: Physical Therapy

## 2019-06-05 ENCOUNTER — Other Ambulatory Visit: Payer: Self-pay

## 2019-06-05 ENCOUNTER — Ambulatory Visit: Payer: Medicare Other | Attending: Physician Assistant | Admitting: Physical Therapy

## 2019-06-05 DIAGNOSIS — R269 Unspecified abnormalities of gait and mobility: Secondary | ICD-10-CM | POA: Insufficient documentation

## 2019-06-05 DIAGNOSIS — M6281 Muscle weakness (generalized): Secondary | ICD-10-CM | POA: Diagnosis present

## 2019-06-05 NOTE — Addendum Note (Signed)
Addended by: Pura Spice on: 06/05/2019 07:56 AM   Modules accepted: Orders

## 2019-06-05 NOTE — Therapy (Signed)
[place] [facility] [address] [place], [place], [address] Phone: [phone]   Fax:  [phone]  Physical Therapy Treatment  Patient Details  Name: Brandy Hunt MRN: [id number] Date of Birth: [birth year] Referring Provider (PT): Levada Dy, [place]   Encounter Date: 06/05/2019  PT End of Session - 06/05/19 0810    Visit Number  5    Number of Visits  12    Date for PT Re-Evaluation  06/29/19    Authorization - Visit Number  2    Authorization - Number of Visits  10    PT Start Time  0811    PT Stop Time  0856    PT Time Calculation (min)  45 min    Equipment Utilized During Treatment  Gait belt    Activity Tolerance  Patient tolerated treatment well    Behavior During Therapy  [facility] for tasks assessed/performed       Past Medical History:  Diagnosis Date  . CAD (coronary artery disease)   . Cancer ([place])   . Glaucoma   . Hypertension   . Keratosis   . MI, old 2002  . Myalgia   . Osteopenia   . Paroxysmal A-fib ([place])   . Raynaud disease     Past Surgical History:  Procedure Laterality Date  . BREAST SURGERY      There were no vitals filed for this visit.  Subjective Assessment - 06/05/19 0808    Subjective  Pt. reports she had a good weekend.  Pt. reports she was active over the weekend.    Pertinent History  Pt. widowed and lives alone.  Pt. has 2 kids (son/ daughter) and 3 grandkids.  Goes by [facility]    Patient Stated Goals  Improve LE strength/ balance with walking.    Currently in Pain?  No/denies          TREATMENT  Therapeutic Exercise:  Nustep B UE/LE, seat 8, L5, x10 min for cardiovascular conditioning and improved BLE strength Seated RTB hip abduction/ standing marching and lateral walking in //-bars 5x L/R.  Mirror for feedback.  Standing (4#) LE ex.:  hip flexion/ abduction/ extension/ knee flexion/ heel raises 30x (LE strengthening/ cuing for upright posture).   Neuromuscular  Re-education:  Walking in hallway with added head turns/ alt. UE and LE touches/ turning 4x. STS 10x with no UE assist (good mechanics/ mirror feedback for posture).  Tandem walking with 1 fingertip support, CGA/SBA. VCs for improved trunk control and maintained balance Walking in hallway with hurdles and recip. Pattern (2 small LOB with self-correction) Walking on blue mat with uneven wts under for balance/ankle control.   Walking outside on varying terrain/ parking lot working on maintaining midline and preventing staggering gait.    PT Long Term Goals - 06/05/19 0750      PT LONG TERM GOAL #1   Title  Pt. will increase FOTO to 76 to improve functional mobility/ independence.    Baseline  Initial FOTO: 73    Time  4    Period  Weeks    Status  On-going    Target Date  06/29/19      PT LONG TERM GOAL #2   Title  Pt. will increase Berg balance test to >53 out of 56 to improve balance/ decrease fall risk.    Baseline  Initial Berg: 50/56    Time  4    Period  Weeks    Status  On-going  Target Date  06/29/19      PT LONG TERM GOAL #3   Title  Pt. independent with HEP to increase B hip/ankle strength 1/2 muscle grade to improve gait/ functional mobility.    Baseline  B LE muscle strength grossly 5/5 MMT except hip flexion/ ankle 4/5 MMT.    Time  4    Period  Weeks    Status  Partially Met    Target Date  06/29/19      PT LONG TERM GOAL #4   Title  Pt. able to walk 2 miles with no c/o dizziness/ loss of balance to improve safety/ functional mobility/ independence.    Baseline  Pt. reports occasional staggering gait/ dizziness with increase activity.    Time  4    Period  Weeks    Status  Partially Met    Target Date  06/29/19         Plan - 06/05/19 0811    Clinical Impression Statement  Pt. ambulates around PT clinic with improved control/ cadence.  Pt. challenged with dynamic balance activities and LE strengthening with resistance.  Pt. only had 1 LOB during  tandem gait forward/ backwards in //-bars (marked improvement).  Pt. also demonstrates marked improvement with walking alt. UE and LE touches.  No changes to HEP.    Stability/Clinical Decision Making  Stable/Uncomplicated    Clinical Decision Making  Low    Rehab Potential  Good    PT Frequency  2x / week    PT Duration  4 weeks    PT Treatment/Interventions  ADLs/Self Care Home Management;Aquatic Therapy;Stair training;Functional mobility training;Neuromuscular re-education;Balance training;Gait training;Therapeutic exercise;Therapeutic activities;Patient/family education;Manual techniques;Passive range of motion    PT Next Visit Plan  Dynamic balance.  Ankle/knee strengthening progression.    PT Home Exercise Plan  [phone]       Patient will benefit from skilled therapeutic intervention in order to improve the following deficits and impairments:  Abnormal gait, Decreased balance, Decreased endurance, Decreased mobility, Difficulty walking, Improper body mechanics, Decreased activity tolerance, Decreased strength, Postural dysfunction  Visit Diagnosis: Gait disturbance  Muscle weakness (generalized)     Problem List Patient Active Problem List   Diagnosis Date Noted  . Atrial flutter with rapid ventricular response ([place]) 11/25/2018  . Bilateral hand pain 08/10/2018  . Bilateral hand numbness 08/10/2018  . Impaired glucose tolerance 06/08/2018  . B12 deficiency 06/08/2018  . Weakness of both hands 06/08/2018  . Gait disturbance 03/17/2017  . Hyperkalemia 10/20/2016  . Anxiety 10/20/2015  . Heart palpitations 09/10/2015  . Bilateral carpal tunnel syndrome 05/09/2015  . CRI (chronic renal insufficiency), stage 3 (moderate) 04/18/2015  . High risk medication use 10/14/2014  . Muscle cramps 04/08/2014  . Hypercholesterolemia 04/08/2014  . Coronary artery disease of native artery of native heart with stable angina pectoris ([place]) 04/08/2014  . Benign essential hypertension  09/07/2013  . Seasonal allergic rhinitis 09/07/2013  . Femoral bruit 04/17/2013  . Raynauds phenomenon 03/30/2012  . Myalgia 03/31/2011  . Osteopenia 03/31/2011  . Paroxysmal atrial fibrillation ([place]) 03/31/2011  . Insomnia 08/31/2010  . History of squamous cell carcinoma 08/31/2010  . History of breast cancer in female 08/31/2010  . History of actinic keratosis 08/31/2010  . Glaucoma, closed-angle 08/31/2010  . Chronic constipation 08/31/2010  . Atopic dermatitis 08/31/2010   Pura Spice, PT, DPT # [phone] 06/05/2019, 8:57 AM  [place] [facility] [address] [place], [place], [address] Phone: [phone]  Fax:  [phone]  Name: ROLA LENNON MRN: [id number] Date of Birth: 1930-07-30

## 2019-06-07 ENCOUNTER — Other Ambulatory Visit: Payer: Self-pay

## 2019-06-07 ENCOUNTER — Encounter: Payer: Self-pay | Admitting: Physical Therapy

## 2019-06-07 ENCOUNTER — Ambulatory Visit: Payer: Medicare Other | Admitting: Physical Therapy

## 2019-06-07 DIAGNOSIS — R269 Unspecified abnormalities of gait and mobility: Secondary | ICD-10-CM

## 2019-06-07 DIAGNOSIS — M6281 Muscle weakness (generalized): Secondary | ICD-10-CM

## 2019-06-07 NOTE — Therapy (Signed)
[facility] [address] [place], [place], [address] Phone: [phone]   Fax:  [phone]  Physical Therapy Treatment  Patient Details  Name: Brandy Hunt MRN: [id number] Date of Birth: [birth year] Referring Provider (PT): Levada Dy, [place]   Encounter Date: 06/07/2019  PT End of Session - 06/07/19 0810    Visit Number  6    Number of Visits  12    Date for PT Re-Evaluation  06/29/19    Authorization - Visit Number  3    Authorization - Number of Visits  10    PT Start Time  0803    PT Stop Time  0850    PT Time Calculation (min)  47 min    Equipment Utilized During Treatment  Gait belt    Activity Tolerance  Patient tolerated treatment well    Behavior During Therapy  [facility] for tasks assessed/performed       Past Medical History:  Diagnosis Date  . CAD (coronary artery disease)   . Cancer ([place])   . Glaucoma   . Hypertension   . Keratosis   . MI, old 2002  . Myalgia   . Osteopenia   . Paroxysmal A-fib ([place])   . Raynaud disease     Past Surgical History:  Procedure Laterality Date  . BREAST SURGERY      There were no vitals filed for this visit.  Subjective Assessment - 06/07/19 0807    Subjective  Pt. states she had a little bit of staggering gait yesterday while walking but no falls.  Pt. entered PT with no new complaints and no assistive device.    Pertinent History  Pt. widowed and lives alone.  Pt. has 2 kids (son/ daughter) and 3 grandkids.  Goes by [facility]    Patient Stated Goals  Improve LE strength/ balance with walking.    Currently in Pain?  No/denies         TREATMENT  Therapeutic Exercise:  Nustep B UE/LE, seat 9, L5, x10 min for cardiovascular conditioning and improved BLE strength  Standing(4#) LE ex.:hip flexion/ abduction/ extension/ knee flexion/heel and toe raises 30x (LE strengthening/ cuing for upright posture). Mirror feedback with no UE assist.    Neuromuscular  Re-education:  Walking in hallway with alt. UE and LE touches/ turning/ high marching4x. Walking in clinic with varying cadence/ stops and starts. STS 10x with no UE assist (good mechanics/ mirror feedback for posture). Tandem stance on Airex pad >10 sec. L/R.  .   Walking outside on varying terrain/ parking lot working on maintaining midline and preventing staggering gait.     PT Long Term Goals - 06/05/19 0750      PT LONG TERM GOAL #1   Title  Pt. will increase FOTO to 76 to improve functional mobility/ independence.    Baseline  Initial FOTO: 73    Time  4    Period  Weeks    Status  On-going    Target Date  06/29/19      PT LONG TERM GOAL #2   Title  Pt. will increase Berg balance test to >53 out of 56 to improve balance/ decrease fall risk.    Baseline  Initial Berg: 50/56    Time  4    Period  Weeks    Status  On-going    Target Date  06/29/19      PT LONG TERM GOAL #3   Title  Pt. independent  with HEP to increase B hip/ankle strength 1/2 muscle grade to improve gait/ functional mobility.    Baseline  B LE muscle strength grossly 5/5 MMT except hip flexion/ ankle 4/5 MMT.    Time  4    Period  Weeks    Status  Partially Met    Target Date  06/29/19      PT LONG TERM GOAL #4   Title  Pt. able to walk 2 miles with no c/o dizziness/ loss of balance to improve safety/ functional mobility/ independence.    Baseline  Pt. reports occasional staggering gait/ dizziness with increase activity.    Time  4    Period  Weeks    Status  Partially Met    Target Date  06/29/19         Plan - 06/07/19 0810    Clinical Impression Statement  Pt. did really well with hallway walking/ cadence changes with mod. I/SBA for cuing/ safety.  Pt. had 2 episodes of staggering gait while walking with increase hip flexion in hallway.  Pt. tends to sway to the L but able to correct without UE assist.  Pt. works hard during resisted ther.ex. and marked increase in tandem gait while  standing on Airex pad.    Stability/Clinical Decision Making  Stable/Uncomplicated    Clinical Decision Making  Low    Rehab Potential  Good    PT Frequency  2x / week    PT Duration  4 weeks    PT Treatment/Interventions  ADLs/Self Care Home Management;Aquatic Therapy;Stair training;Functional mobility training;Neuromuscular re-education;Balance training;Gait training;Therapeutic exercise;Therapeutic activities;Patient/family education;Manual techniques;Passive range of motion    PT Next Visit Plan  Dynamic balance.  Ankle/knee strengthening progression.  2 more tx. session prior to beach trip.    PT Home Exercise Plan  [phone]       Patient will benefit from skilled therapeutic intervention in order to improve the following deficits and impairments:  Abnormal gait, Decreased balance, Decreased endurance, Decreased mobility, Difficulty walking, Improper body mechanics, Decreased activity tolerance, Decreased strength, Postural dysfunction  Visit Diagnosis: Gait disturbance  Muscle weakness (generalized)     Problem List Patient Active Problem List   Diagnosis Date Noted  . Atrial flutter with rapid ventricular response ([place]) 11/25/2018  . Bilateral hand pain 08/10/2018  . Bilateral hand numbness 08/10/2018  . Impaired glucose tolerance 06/08/2018  . B12 deficiency 06/08/2018  . Weakness of both hands 06/08/2018  . Gait disturbance 03/17/2017  . Hyperkalemia 10/20/2016  . Anxiety 10/20/2015  . Heart palpitations 09/10/2015  . Bilateral carpal tunnel syndrome 05/09/2015  . CRI (chronic renal insufficiency), stage 3 (moderate) 04/18/2015  . High risk medication use 10/14/2014  . Muscle cramps 04/08/2014  . Hypercholesterolemia 04/08/2014  . Coronary artery disease of native artery of native heart with stable angina pectoris ([place]) 04/08/2014  . Benign essential hypertension 09/07/2013  . Seasonal allergic rhinitis 09/07/2013  . Femoral bruit 04/17/2013  . Raynauds phenomenon  03/30/2012  . Myalgia 03/31/2011  . Osteopenia 03/31/2011  . Paroxysmal atrial fibrillation ([place]) 03/31/2011  . Insomnia 08/31/2010  . History of squamous cell carcinoma 08/31/2010  . History of breast cancer in female 08/31/2010  . History of actinic keratosis 08/31/2010  . Glaucoma, closed-angle 08/31/2010  . Chronic constipation 08/31/2010  . Atopic dermatitis 08/31/2010   Pura Spice, PT, DPT # [phone] 06/07/2019, 9:20 AM  [facility] [address] [place], [place], [address] Phone: [phone]  Fax:  [phone]  Name: CHARMANE PROTZMAN MRN: [id number] Date of Birth: [birth year]

## 2019-06-12 ENCOUNTER — Ambulatory Visit: Payer: Medicare Other | Admitting: Physical Therapy

## 2019-06-12 ENCOUNTER — Encounter: Payer: Self-pay | Admitting: Physical Therapy

## 2019-06-12 ENCOUNTER — Other Ambulatory Visit: Payer: Self-pay

## 2019-06-12 DIAGNOSIS — R269 Unspecified abnormalities of gait and mobility: Secondary | ICD-10-CM | POA: Diagnosis not present

## 2019-06-12 DIAGNOSIS — M6281 Muscle weakness (generalized): Secondary | ICD-10-CM

## 2019-06-12 NOTE — Therapy (Signed)
[facility] [address] [place], [place], [address] Phone: [phone]   Fax:  [phone]  Physical Therapy Treatment  Patient Details  Name: Brandy Hunt MRN: [id number] Date of Birth: February 23, 1930 Referring Provider (PT): Levada Dy, [place]   Encounter Date: 06/12/2019  PT End of Session - 06/12/19 0828    Visit Number  7    Number of Visits  12    Date for PT Re-Evaluation  06/29/19    Authorization - Visit Number  4    Authorization - Number of Visits  10    PT Start Time  0805    PT Stop Time  4128    PT Time Calculation (min)  49 min    Equipment Utilized During Treatment  Gait belt    Activity Tolerance  Patient tolerated treatment well    Behavior During Therapy  [facility] for tasks assessed/performed       Past Medical History:  Diagnosis Date  . CAD (coronary artery disease)   . Cancer ([place])   . Glaucoma   . Hypertension   . Keratosis   . MI, old 2002  . Myalgia   . Osteopenia   . Paroxysmal A-fib ([place])   . Raynaud disease     Past Surgical History:  Procedure Laterality Date  . BREAST SURGERY      There were no vitals filed for this visit.  Subjective Assessment - 06/12/19 0803    Subjective  Pt. reports she is doing well.  Pt. planning to go to beach this weekend with daughter until end of May.    Pertinent History  Pt. widowed and lives alone.  Pt. has 2 kids (son/ daughter) and 3 grandkids.  Goes by [facility]    Patient Stated Goals  Improve LE strength/ balance with walking.    Currently in Pain?  No/denies        TREATMENT  Therapeutic Exercise:  NustepB UE/LE, seat 9, L5, x10 min for cardiovascular conditioning and improved BLE strength  Standing(5#) LE ex.:hip flexion/ abduction/ extension/ knee flexion/heel and toe raises 30x (LE strengthening/ cuing for upright posture). Mirror feedback with no UE assist.    Neuromuscular Re-education:  STS 10x with no UE assist (good  mechanics/ mirror feedback for posture). 5x with use of Airex under feet (no UE but extra time/ focus). Walking in hallway with alt. UE and LE touches/ high marching5x.   Walking in clinic with varying cadence/ stops and starts. Walking outside on varying terrain/ parking lot working on maintaining midline and preventing staggering gait.     PT Long Term Goals - 06/05/19 0750      PT LONG TERM GOAL #1   Title  Pt. will increase FOTO to 76 to improve functional mobility/ independence.    Baseline  Initial FOTO: 73    Time  4    Period  Weeks    Status  On-going    Target Date  06/29/19      PT LONG TERM GOAL #2   Title  Pt. will increase Berg balance test to >53 out of 56 to improve balance/ decrease fall risk.    Baseline  Initial Berg: 50/56    Time  4    Period  Weeks    Status  On-going    Target Date  06/29/19      PT LONG TERM GOAL #3   Title  Pt. independent with HEP to increase B hip/ankle strength  1/2 muscle grade to improve gait/ functional mobility.    Baseline  B LE muscle strength grossly 5/5 MMT except hip flexion/ ankle 4/5 MMT.    Time  4    Period  Weeks    Status  Partially Met    Target Date  06/29/19      PT LONG TERM GOAL #4   Title  Pt. able to walk 2 miles with no c/o dizziness/ loss of balance to improve safety/ functional mobility/ independence.    Baseline  Pt. reports occasional staggering gait/ dizziness with increase activity.    Time  4    Period  Weeks    Status  Partially Met    Target Date  06/29/19            Plan - 06/12/19 0829    Clinical Impression Statement  Pt. continues to impress with dynamic balance/ hallway activities.  Pt. has good endurance with resisted ther.ex./ Nustep ex.  Pt. will continue with current HEP and PT will progress HEP next tx. session prior to beach trip.  Probable discharge next visit/ CHECK GOALS.    Stability/Clinical Decision Making  Stable/Uncomplicated    Clinical Decision Making  Low    Rehab  Potential  Good    PT Frequency  2x / week    PT Duration  4 weeks    PT Treatment/Interventions  ADLs/Self Care Home Management;Aquatic Therapy;Stair training;Functional mobility training;Neuromuscular re-education;Balance training;Gait training;Therapeutic exercise;Therapeutic activities;Patient/family education;Manual techniques;Passive range of motion    PT Next Visit Plan  Dynamic balance.  Ankle/knee strengthening progression.  1 more tx. session prior to beach trip.  Probable discharge next visit.    PT Home Exercise Plan  [phone]       Patient will benefit from skilled therapeutic intervention in order to improve the following deficits and impairments:  Abnormal gait, Decreased balance, Decreased endurance, Decreased mobility, Difficulty walking, Improper body mechanics, Decreased activity tolerance, Decreased strength, Postural dysfunction  Visit Diagnosis: Gait disturbance  Muscle weakness (generalized)     Problem List Patient Active Problem List   Diagnosis Date Noted  . Atrial flutter with rapid ventricular response ([place]) 11/25/2018  . Bilateral hand pain 08/10/2018  . Bilateral hand numbness 08/10/2018  . Impaired glucose tolerance 06/08/2018  . B12 deficiency 06/08/2018  . Weakness of both hands 06/08/2018  . Gait disturbance 03/17/2017  . Hyperkalemia 10/20/2016  . Anxiety 10/20/2015  . Heart palpitations 09/10/2015  . Bilateral carpal tunnel syndrome 05/09/2015  . CRI (chronic renal insufficiency), stage 3 (moderate) 04/18/2015  . High risk medication use 10/14/2014  . Muscle cramps 04/08/2014  . Hypercholesterolemia 04/08/2014  . Coronary artery disease of native artery of native heart with stable angina pectoris ([place]) 04/08/2014  . Benign essential hypertension 09/07/2013  . Seasonal allergic rhinitis 09/07/2013  . Femoral bruit 04/17/2013  . Raynauds phenomenon 03/30/2012  . Myalgia 03/31/2011  . Osteopenia 03/31/2011  . Paroxysmal atrial fibrillation  ([place]) 03/31/2011  . Insomnia 08/31/2010  . History of squamous cell carcinoma 08/31/2010  . History of breast cancer in female 08/31/2010  . History of actinic keratosis 08/31/2010  . Glaucoma, closed-angle 08/31/2010  . Chronic constipation 08/31/2010  . Atopic dermatitis 08/31/2010   Pura Spice, PT, DPT # [phone] 06/12/2019, 10:35 AM  [facility] [address] [place], [place], [address] Phone: [phone]   Fax:  [phone]  Name: JUVIA AERTS MRN: [id number] Date of Birth: 03-28-1930

## 2019-06-14 ENCOUNTER — Ambulatory Visit: Payer: Medicare Other | Admitting: Physical Therapy

## 2019-06-14 ENCOUNTER — Other Ambulatory Visit: Payer: Self-pay

## 2019-06-14 ENCOUNTER — Encounter: Payer: Self-pay | Admitting: Physical Therapy

## 2019-06-14 DIAGNOSIS — R269 Unspecified abnormalities of gait and mobility: Secondary | ICD-10-CM

## 2019-06-14 DIAGNOSIS — M6281 Muscle weakness (generalized): Secondary | ICD-10-CM

## 2019-06-14 NOTE — Therapy (Signed)
[facility] [address] [place], [place], [address] Phone: [phone]   Fax:  [phone]  Physical Therapy Treatment/Discharge  Patient Details  Name: Brandy Hunt MRN: [id number] Date of Birth: Feb 14, 1930 Referring Provider (PT): Levada Dy, [place]   Encounter Date: 06/14/2019  PT End of Session - 06/14/19 0836    Visit Number  8    Number of Visits  12    Date for PT Re-Evaluation  06/29/19    Authorization - Visit Number  5    Authorization - Number of Visits  10    PT Start Time  0806    PT Stop Time  0854    PT Time Calculation (min)  48 min    Equipment Utilized During Treatment  Gait belt    Activity Tolerance  Patient tolerated treatment well    Behavior During Therapy  [facility] for tasks assessed/performed       Past Medical History:  Diagnosis Date  . CAD (coronary artery disease)   . Cancer ([place])   . Glaucoma   . Hypertension   . Keratosis   . MI, old 2002  . Myalgia   . Osteopenia   . Paroxysmal A-fib ([place])   . Raynaud disease     Past Surgical History:  Procedure Laterality Date  . BREAST SURGERY      There were no vitals filed for this visit.  Subjective Assessment - 06/14/19 0855    Subjective  Pt. states she is doing well.  Pt. heading to beach this weekend.    Pertinent History  Pt. widowed and lives alone.  Pt. has 2 kids (son/ daughter) and 3 grandkids.  Goes by [facility]    Patient Stated Goals  Improve LE strength/ balance with walking.    Currently in Pain?  No/denies        TREATMENT  Therapeutic Exercise:  NustepB UE/LE, seat9, L5, x10 min for cardiovascular conditioning and improved BLE strength.  Reviewed HEP  Neuromuscular Re-education:  Berg balance test: 54/56 (goal met)  Walking in hallway with alt. UE and LE touches/ high marching5x.   Walking in clinic with varying cadence/ stops and starts. Walking outside on varying terrain/ parking lot working on  maintaining midline and preventing staggering gait.  Ascending (no UE assist)/ descending stairs (1 UE assist) outside of clinic.       PT Long Term Goals - 06/14/19 0837      PT LONG TERM GOAL #1   Title  Pt. will increase FOTO to 76 to improve functional mobility/ independence.    Baseline  Initial FOTO: 73.  5/13: 73 (max potential)- pt. unable to run    Time  4    Period  Weeks    Status  Partially Met    Target Date  06/14/19      PT LONG TERM GOAL #2   Title  Pt. will increase Berg balance test to >53 out of 56 to improve balance/ decrease fall risk.    Baseline  Initial Berg: 50/56.  5/13: 54/56    Time  4    Period  Weeks    Status  Achieved    Target Date  06/14/19      PT LONG TERM GOAL #3   Title  Pt. independent with HEP to increase B hip/ankle strength 1/2 muscle grade to improve gait/ functional mobility.    Baseline  B LE muscle strength grossly 5/5 MMT except hip flexion/  ankle 4/5 MMT.  5/13: 5/5 MMT except hip flexion 4+/5 MMT    Time  4    Period  Weeks    Status  Achieved    Target Date  06/14/19      PT LONG TERM GOAL #4   Title  Pt. able to walk 2 miles with no c/o dizziness/ loss of balance to improve safety/ functional mobility/ independence.    Baseline  Pt. reports occasional staggering gait/ dizziness with increase activity.   5/13:    Time  4    Period  Weeks    Status  Achieved    Target Date  06/14/19            Plan - 06/14/19 0904    Clinical Impression Statement  Pt. has progressed well towards all PT goals.  Pt. understands importance of HEP and daily walking.  Pt. has no LOB or falls recently.  Pt. will continue with HEP and instructed to contact PT if any issues or questions in following weeks/months.  Discharge from PT at this time.    Stability/Clinical Decision Making  Stable/Uncomplicated    Clinical Decision Making  Low    Rehab Potential  Good    PT Frequency  2x / week    PT Duration  4 weeks    PT  Treatment/Interventions  ADLs/Self Care Home Management;Aquatic Therapy;Stair training;Functional mobility training;Neuromuscular re-education;Balance training;Gait training;Therapeutic exercise;Therapeutic activities;Patient/family education;Manual techniques;Passive range of motion    PT Next Visit Plan  Discharge    PT [place]       Patient will benefit from skilled therapeutic intervention in order to improve the following deficits and impairments:  Abnormal gait, Decreased balance, Decreased endurance, Decreased mobility, Difficulty walking, Improper body mechanics, Decreased activity tolerance, Decreased strength, Postural dysfunction  Visit Diagnosis: Gait disturbance  Muscle weakness (generalized)     Problem List Patient Active Problem List   Diagnosis Date Noted  . Atrial flutter with rapid ventricular response ([place]) 11/25/2018  . Bilateral hand pain 08/10/2018  . Bilateral hand numbness 08/10/2018  . Impaired glucose tolerance 06/08/2018  . B12 deficiency 06/08/2018  . Weakness of both hands 06/08/2018  . Gait disturbance 03/17/2017  . Hyperkalemia 10/20/2016  . Anxiety 10/20/2015  . Heart palpitations 09/10/2015  . Bilateral carpal tunnel syndrome 05/09/2015  . CRI (chronic renal insufficiency), stage 3 (moderate) 04/18/2015  . High risk medication use 10/14/2014  . Muscle cramps 04/08/2014  . Hypercholesterolemia 04/08/2014  . Coronary artery disease of native artery of native heart with stable angina pectoris ([place]) 04/08/2014  . Benign essential hypertension 09/07/2013  . Seasonal allergic rhinitis 09/07/2013  . Femoral bruit 04/17/2013  . Raynauds phenomenon 03/30/2012  . Myalgia 03/31/2011  . Osteopenia 03/31/2011  . Paroxysmal atrial fibrillation ([place]) 03/31/2011  . Insomnia 08/31/2010  . History of squamous cell carcinoma 08/31/2010  . History of breast cancer in female 08/31/2010  . History of actinic keratosis 08/31/2010  .  Glaucoma, closed-angle 08/31/2010  . Chronic constipation 08/31/2010  . Atopic dermatitis 08/31/2010   Pura Spice, PT, DPT # [phone] 06/14/2019, 9:10 AM   [facility] [address] [place], [place], [address] Phone: [phone]   Fax:  [phone]  Name: JEANNEMARIE SAWAYA MRN: [id number] Date of Birth: 1930/08/11

## 2019-06-19 ENCOUNTER — Encounter: Payer: Medicare Other | Admitting: Physical Therapy

## 2019-06-21 ENCOUNTER — Encounter: Payer: Medicare Other | Admitting: Physical Therapy

## 2019-12-29 ENCOUNTER — Other Ambulatory Visit: Payer: Self-pay

## 2019-12-29 ENCOUNTER — Encounter: Payer: Self-pay | Admitting: Emergency Medicine

## 2019-12-29 ENCOUNTER — Emergency Department
Admission: EM | Admit: 2019-12-29 | Discharge: 2019-12-29 | Disposition: A | Discharge status: Home / Self Care | Payer: Medicare Other | Attending: Emergency Medicine | Admitting: Emergency Medicine

## 2019-12-29 DIAGNOSIS — I4892 Unspecified atrial flutter: Secondary | ICD-10-CM | POA: Insufficient documentation

## 2019-12-29 DIAGNOSIS — I10 Essential (primary) hypertension: Secondary | ICD-10-CM | POA: Diagnosis not present

## 2019-12-29 DIAGNOSIS — Z7901 Long term (current) use of anticoagulants: Secondary | ICD-10-CM | POA: Diagnosis not present

## 2019-12-29 DIAGNOSIS — K5641 Fecal impaction: Secondary | ICD-10-CM | POA: Insufficient documentation

## 2019-12-29 DIAGNOSIS — Z79899 Other long term (current) drug therapy: Secondary | ICD-10-CM | POA: Insufficient documentation

## 2019-12-29 DIAGNOSIS — K5900 Constipation, unspecified: Secondary | ICD-10-CM | POA: Diagnosis present

## 2019-12-29 DIAGNOSIS — I2510 Atherosclerotic heart disease of native coronary artery without angina pectoris: Secondary | ICD-10-CM | POA: Diagnosis not present

## 2019-12-29 LAB — CBC
HCT: 40 % (ref 36.0–46.0)
Hemoglobin: 12.7 g/dL (ref 12.0–15.0)
MCH: 29.1 pg (ref 26.0–34.0)
MCHC: 31.8 g/dL (ref 30.0–36.0)
MCV: 91.5 fL (ref 80.0–100.0)
Platelets: 355 10*3/uL (ref 150–400)
RBC: 4.37 MIL/uL (ref 3.87–5.11)
RDW: 16.2 % — ABNORMAL HIGH (ref 11.5–15.5)
WBC: 16.2 10*3/uL — ABNORMAL HIGH (ref 4.0–10.5)
nRBC: 0 % (ref 0.0–0.2)

## 2019-12-29 LAB — COMPREHENSIVE METABOLIC PANEL
ALT: 32 U/L (ref 0–44)
AST: 19 U/L (ref 15–41)
Albumin: 3.4 g/dL — ABNORMAL LOW (ref 3.5–5.0)
Alkaline Phosphatase: 54 U/L (ref 38–126)
Anion gap: 9 (ref 5–15)
BUN: 11 mg/dL (ref 8–23)
CO2: 29 mmol/L (ref 22–32)
Calcium: 8.5 mg/dL — ABNORMAL LOW (ref 8.9–10.3)
Chloride: 97 mmol/L — ABNORMAL LOW (ref 98–111)
Creatinine, Ser: 1.03 mg/dL — ABNORMAL HIGH (ref 0.44–1.00)
GFR, Estimated: 53 mL/min — ABNORMAL LOW (ref >60)
Glucose, Bld: 108 mg/dL — ABNORMAL HIGH (ref 70–99)
Potassium: 4 mmol/L (ref 3.5–5.1)
Sodium: 135 mmol/L (ref 135–145)
Total Bilirubin: 1.1 mg/dL (ref 0.3–1.2)
Total Protein: 6.3 g/dL — ABNORMAL LOW (ref 6.5–8.1)

## 2019-12-29 NOTE — ED Triage Notes (Signed)
Pt reports is having trouble getting her stool out. Pt reports does a lot of straining and it wont come. Pt reports started with rectal bleeding. Pt reports blood is dark red. Pt asked if blood was black in color or coffee ground colored based on EMS report but pt states it is not, pt states it is deep red. Pt reports pressure in her rectum.

## 2019-12-29 NOTE — ED Notes (Signed)
Pt states that she has been constipated for the past few days. Pt can feel hard stool in her rectum but is unable to pass the stool. Pt also reports some rectal bleeding. Pt is on blood thinners.

## 2019-12-29 NOTE — ED Provider Notes (Signed)
[facility] Emergency Department Provider Note   ____________________________________________    I have reviewed the triage vital signs and the nursing notes.   HISTORY  Chief Complaint Constipation and rectal bleeding    HPI Brandy Hunt is a 86 y.o. female with a history of atrial flutter on Eliquis who presents with complaints primarily of constipation.  Patient reports has not been able to successfully have a bowel movement for several days.  She feels as though there was a hard stool in her rectum.  She reports she has been straining significantly on the toilet and noticed some dark blood in the toilet but no stool.  Has been taking Senokot with little improvement.  She is on prednisone for treatment of recent bronchitis  Past Medical History:  Diagnosis Date  . CAD (coronary artery disease)   . Cancer ([place])   . Glaucoma   . Hypertension   . Keratosis   . MI, old 2002  . Myalgia   . Osteopenia   . Paroxysmal A-fib ([place])   . Raynaud disease     Patient Active Problem List   Diagnosis Date Noted  . Atrial flutter with rapid ventricular response ([place]) 11/25/2018  . Bilateral hand pain 08/10/2018  . Bilateral hand numbness 08/10/2018  . Impaired glucose tolerance 06/08/2018  . B12 deficiency 06/08/2018  . Weakness of both hands 06/08/2018  . Gait disturbance 03/17/2017  . Hyperkalemia 10/20/2016  . Anxiety 10/20/2015  . Heart palpitations 09/10/2015  . Bilateral carpal tunnel syndrome 05/09/2015  . CRI (chronic renal insufficiency), stage 3 (moderate) ([place]) 04/18/2015  . High risk medication use 10/14/2014  . Muscle cramps 04/08/2014  . Hypercholesterolemia 04/08/2014  . Coronary artery disease of native artery of native heart with stable angina pectoris ([place]) 04/08/2014  . Benign essential hypertension 09/07/2013  . Seasonal allergic rhinitis 09/07/2013  . Femoral bruit 04/17/2013  . Raynauds phenomenon 03/30/2012  . Myalgia  03/31/2011  . Osteopenia 03/31/2011  . Paroxysmal atrial fibrillation ([place]) 03/31/2011  . Insomnia 08/31/2010  . History of squamous cell carcinoma 08/31/2010  . History of breast cancer in female 08/31/2010  . History of actinic keratosis 08/31/2010  . Glaucoma, closed-angle 08/31/2010  . Chronic constipation 08/31/2010  . Atopic dermatitis 08/31/2010    Past Surgical History:  Procedure Laterality Date  . BREAST SURGERY      Prior to Admission medications   Medication Sig Start Date End Date Taking? Authorizing Provider  apixaban (ELIQUIS) 5 MG TABS tablet Take 1 tablet (5 mg total) by mouth 2 (two) times daily. 11/26/18   Bettey Costa, MD  Cholecalciferol (VITAMIN D) 2000 UNITS tablet Take 2,000 Units by mouth daily.    [provider]  cyanocobalamin 1000 MCG tablet Take 1,000 mcg by mouth daily.    [provider]  ezetimibe (ZETIA) 10 MG tablet Take 10 mg by mouth daily.    [provider]  fexofenadine (ALLEGRA) 180 MG tablet Take 180 mg by mouth daily.    [provider]  fluticasone (FLONASE) 50 MCG/ACT nasal spray Place into both nostrils daily.    [provider]  ketoconazole (NIZORAL) 2 % cream Apply 1 application topically daily as needed for irritation.    [provider]  losartan (COZAAR) 25 MG tablet Take 1 tablet (25 mg total) by mouth daily. 11/26/18   Bettey Costa, MD  magnesium oxide (MAG-OX) 400 MG tablet Take 1 tablet by mouth daily.     [provider]  metoprolol succinate (TOPROL-XL) 100 MG 24 hr tablet Take 1 tablet (100 mg total) by mouth daily. Take with or immediately following a meal. 11/26/18   Bettey Costa, MD  nitroGLYCERIN (NITROSTAT) 0.4 MG SL tablet Place 0.4 mg under the tongue every 5 (five) minutes as needed for chest pain.    [provider]  polyethylene glycol (MIRALAX / GLYCOLAX) packet Take 17 g by mouth daily.    [provider]     Allergies Erythromycin,  Sulfites, Nitrofurantoin, Cephalexin, Ciprofloxacin, Niacin and related, Penicillins, Ramipril, Septra [sulfamethoxazole-trimethoprim], Statins, Griseofulvin, and Macrobid [nitrofurantoin monohyd macro]  No family history on file.  Social History Social History   Tobacco Use  . Smoking status: Never Smoker  . Smokeless tobacco: Never Used  Substance Use Topics  . Alcohol use: No  . Drug use: Never    Review of Systems  Constitutional: No fever/chills Eyes: No visual changes.  ENT: No sore throat. Cardiovascular: Denies chest pain. Respiratory: Denies shortness of breath. Gastrointestinal: As above Genitourinary: Negative for dysuria. Musculoskeletal: Negative for back pain. Skin: Negative for rash. Neurological: Negative for headaches   ____________________________________________   PHYSICAL EXAM:  VITAL SIGNS: ED Triage Vitals [12/29/19 0731]  Enc Vitals Group     BP (!) 153/55     Pulse Rate 65     Resp 20     Temp 98.3 F (36.8 C)     Temp Source Oral     SpO2 98 %     Weight 60.3 kg (133 lb)     Height 1.702 m (5\' 7" )     Head Circumference      Peak Flow      Pain Score 6     Pain Loc      Pain Edu?      Excl. in [place]?     Constitutional: Alert and oriented.  Pleasant and interactive Eyes: Conjunctivae are normal.  Head: Atraumatic.  Mouth/Throat: Mucous membranes are moist.   Neck:  Painless ROM Cardiovascular: Normal rate, .  Good peripheral circulation. Respiratory: Normal respiratory effort.  No retractions Gastrointestinal: Soft and nontender. No distention.  No CVA tenderness.  Rectal exam: Brown stool some dark red blood along the outside suspicious for hemorrhoidal bleeding.  2 hemorrhoids noted externally 1 with evidence of recent bleeding  Musculoskeletal: Warm and well perfused Neurologic:  Normal speech and language. No gross focal neurologic deficits are appreciated.  Skin:  Skin is warm, dry and intact. No rash noted. Psychiatric:  Mood and affect are normal. Speech and behavior are normal.  ____________________________________________   LABS (all labs ordered are listed, but only abnormal results are displayed)  Labs Reviewed  COMPREHENSIVE METABOLIC PANEL - Abnormal; Notable for the following components:      Result Value   Chloride 97 (*)    Glucose, Bld 108 (*)    Creatinine, Ser 1.03 (*)    Calcium 8.5 (*)    Total Protein 6.3 (*)    Albumin 3.4 (*)    GFR, Estimated 53 (*)    All other components within normal limits  CBC - Abnormal; Notable for the following components:   WBC 16.2 (*)    RDW 16.2 (*)    All other components within normal limits  POC OCCULT BLOOD, ED   ____________________________________________  EKG  None ____________________________________________  RADIOLOGY  None ____________________________________________   PROCEDURES  Procedure(s) performed: yes  ------------------------------------------------------------------------------------------------------------------- Fecal Disimpaction Procedure Note:  Performed by me:  Patient placed in  the lateral recumbent position with knees drawn towards chest. Nurse present for patient support. Large amount of hard brown stool removed with some streaks of dark red blood. No complications during procedure.   ------------------------------------------------------------------------------------------------------------------    Procedures   Critical Care performed: No ____________________________________________   INITIAL IMPRESSION / ASSESSMENT AND PLAN / ED COURSE  Pertinent labs & imaging results that were available during my care of the patient were reviewed by me and considered in my medical decision making (see chart for details).  Patient presents with constipation and rectal bleeding noted as above.  Suspicious for hemorrhoidal bleeding given development of constipation followed by bleeding after straining on the  toilet.  She is on Eliquis however.  Lab work is reassuring, hemoglobin is in line with prior levels, white blood cell count is elevated, likely because she is on prednisone.  Disimpaction performed, large amount of hard brown stool in the rectum, significant relief after disimpaction.  Stool is brown and normal, evidence of hemorrhoidal bleeding noted.  Recommend MiraLAX, return precautions of any black stool    ____________________________________________   FINAL CLINICAL IMPRESSION(S) / ED DIAGNOSES  Final diagnoses:  Fecal impaction ([place])        Note:  This document was prepared using Dragon voice recognition software and may include unintentional dictation errors.   Lavonia Drafts, MD 12/29/19 [phone]

## 2019-12-29 NOTE — ED Triage Notes (Signed)
Pt in via EMS from home with c/o constipation for a few days and now dar tarry stools. Pt also with low BO and was taken off her BO meds and now BP elevated. Negative stroke screen. No bright red blood, pt reports dark coffee ground looking. No abd pain, no nausea.

## 2020-01-01 ENCOUNTER — Encounter: Payer: Self-pay | Admitting: Emergency Medicine

## 2020-01-01 DIAGNOSIS — Z79899 Other long term (current) drug therapy: Secondary | ICD-10-CM | POA: Insufficient documentation

## 2020-01-01 DIAGNOSIS — I25118 Atherosclerotic heart disease of native coronary artery with other forms of angina pectoris: Secondary | ICD-10-CM | POA: Diagnosis not present

## 2020-01-01 DIAGNOSIS — I10 Essential (primary) hypertension: Secondary | ICD-10-CM | POA: Diagnosis present

## 2020-01-01 DIAGNOSIS — Z853 Personal history of malignant neoplasm of breast: Secondary | ICD-10-CM | POA: Insufficient documentation

## 2020-01-01 DIAGNOSIS — Z7901 Long term (current) use of anticoagulants: Secondary | ICD-10-CM | POA: Insufficient documentation

## 2020-01-01 DIAGNOSIS — Z85828 Personal history of other malignant neoplasm of skin: Secondary | ICD-10-CM | POA: Insufficient documentation

## 2020-01-01 LAB — TROPONIN I (HIGH SENSITIVITY): Troponin I (High Sensitivity): 6 ng/L (ref <18)

## 2020-01-01 LAB — CBC
HCT: 38.5 % (ref 36.0–46.0)
Hemoglobin: 12.4 g/dL (ref 12.0–15.0)
MCH: 29.2 pg (ref 26.0–34.0)
MCHC: 32.2 g/dL (ref 30.0–36.0)
MCV: 90.8 fL (ref 80.0–100.0)
Platelets: 244 10*3/uL (ref 150–400)
RBC: 4.24 MIL/uL (ref 3.87–5.11)
RDW: 16.9 % — ABNORMAL HIGH (ref 11.5–15.5)
WBC: 9.4 10*3/uL (ref 4.0–10.5)
nRBC: 0 % (ref 0.0–0.2)

## 2020-01-01 LAB — BASIC METABOLIC PANEL
Anion gap: 8 (ref 5–15)
BUN: 18 mg/dL (ref 8–23)
CO2: 27 mmol/L (ref 22–32)
Calcium: 8.4 mg/dL — ABNORMAL LOW (ref 8.9–10.3)
Chloride: 94 mmol/L — ABNORMAL LOW (ref 98–111)
Creatinine, Ser: 0.93 mg/dL (ref 0.44–1.00)
GFR, Estimated: 60 mL/min — ABNORMAL LOW (ref >60)
Glucose, Bld: 112 mg/dL — ABNORMAL HIGH (ref 70–99)
Potassium: 4.5 mmol/L (ref 3.5–5.1)
Sodium: 129 mmol/L — ABNORMAL LOW (ref 135–145)

## 2020-01-01 NOTE — ED Triage Notes (Signed)
Pt arrived via POV with reports of high blood pressure at home, pt states she was here 3 days ago for fecal impaction, pt states she saw PCP yesterday, increased her Losartan to 100mg , pt took med between 4p and 5pm today.  Pt also reports she at seafood today as well.   Denies any chest pain at this time, does reports her head felt funny, but denies any dizziness.

## 2020-01-02 ENCOUNTER — Emergency Department
Admission: EM | Admit: 2020-01-02 | Discharge: 2020-01-02 | Disposition: A | Discharge status: Home / Self Care | Payer: Medicare Other | Attending: Emergency Medicine | Admitting: Emergency Medicine

## 2020-01-02 DIAGNOSIS — I10 Essential (primary) hypertension: Secondary | ICD-10-CM

## 2020-01-02 LAB — TROPONIN I (HIGH SENSITIVITY): Troponin I (High Sensitivity): 7 ng/L (ref <18)

## 2020-01-02 NOTE — ED Provider Notes (Signed)
[facility] Emergency Department Provider Note  ____________________________________________  Time seen: Approximately 5:50 AM  I have reviewed the triage vital signs and the nursing notes.   HISTORY  Chief Complaint Hypertension    HPI Brandy Hunt is a 86 y.o. female with a history of CAD hypertension paroxysmal atrial fibrillation on amiodarone and Eliquis who comes ED complaining of high blood pressure.  Due to her recent hospitalization with hypotension, she was discontinued from all of her antihypertensives except for metoprolol.  Her recent hospital follow-up visit with her PCP, she was restarted on losartan 100 mg daily, which she took for the first time on November 30 at 5 PM.  Denies fever, weight change, hot or cold intolerance, chest pain shortness of breath leg pain vomiting.      Past Medical History:  Diagnosis Date  . CAD (coronary artery disease)   . Cancer ([place])   . Glaucoma   . Hypertension   . Keratosis   . MI, old 2002  . Myalgia   . Osteopenia   . Paroxysmal A-fib ([place])   . Raynaud disease      Patient Active Problem List   Diagnosis Date Noted  . Atrial flutter with rapid ventricular response ([place]) 11/25/2018  . Bilateral hand pain 08/10/2018  . Bilateral hand numbness 08/10/2018  . Impaired glucose tolerance 06/08/2018  . B12 deficiency 06/08/2018  . Weakness of both hands 06/08/2018  . Gait disturbance 03/17/2017  . Hyperkalemia 10/20/2016  . Anxiety 10/20/2015  . Heart palpitations 09/10/2015  . Bilateral carpal tunnel syndrome 05/09/2015  . CRI (chronic renal insufficiency), stage 3 (moderate) ([place]) 04/18/2015  . High risk medication use 10/14/2014  . Muscle cramps 04/08/2014  . Hypercholesterolemia 04/08/2014  . Coronary artery disease of native artery of native heart with stable angina pectoris ([place]) 04/08/2014  . Benign essential hypertension 09/07/2013  . Seasonal allergic rhinitis 09/07/2013  .  Femoral bruit 04/17/2013  . Raynauds phenomenon 03/30/2012  . Myalgia 03/31/2011  . Osteopenia 03/31/2011  . Paroxysmal atrial fibrillation ([place]) 03/31/2011  . Insomnia 08/31/2010  . History of squamous cell carcinoma 08/31/2010  . History of breast cancer in female 08/31/2010  . History of actinic keratosis 08/31/2010  . Glaucoma, closed-angle 08/31/2010  . Chronic constipation 08/31/2010  . Atopic dermatitis 08/31/2010     Past Surgical History:  Procedure Laterality Date  . BREAST SURGERY       Prior to Admission medications   Medication Sig Start Date End Date Taking? Authorizing Provider  apixaban (ELIQUIS) 5 MG TABS tablet Take 1 tablet (5 mg total) by mouth 2 (two) times daily. 11/26/18   Bettey Costa, MD  Cholecalciferol (VITAMIN D) 2000 UNITS tablet Take 2,000 Units by mouth daily.    [provider]  cyanocobalamin 1000 MCG tablet Take 1,000 mcg by mouth daily.    [provider]  ezetimibe (ZETIA) 10 MG tablet Take 10 mg by mouth daily.    [provider]  fexofenadine (ALLEGRA) 180 MG tablet Take 180 mg by mouth daily.    [provider]  fluticasone (FLONASE) 50 MCG/ACT nasal spray Place into both nostrils daily.    [provider]  ketoconazole (NIZORAL) 2 % cream Apply 1 application topically daily as needed for irritation.    [provider]  losartan (COZAAR) 25 MG tablet Take 1 tablet (25 mg total) by mouth daily. 11/26/18   Bettey Costa, MD  magnesium oxide (MAG-OX) 400 MG tablet Take 1 tablet by  mouth daily.     [provider]  metoprolol succinate (TOPROL-XL) 100 MG 24 hr tablet Take 1 tablet (100 mg total) by mouth daily. Take with or immediately following a meal. 11/26/18   Bettey Costa, MD  nitroGLYCERIN (NITROSTAT) 0.4 MG SL tablet Place 0.4 mg under the tongue every 5 (five) minutes as needed for chest pain.    [provider]  polyethylene glycol (MIRALAX / GLYCOLAX) packet Take 17 g by  mouth daily.    [provider]     Allergies Erythromycin, Sulfites, Nitrofurantoin, Cephalexin, Ciprofloxacin, Niacin and related, Penicillins, Ramipril, Septra [sulfamethoxazole-trimethoprim], Statins, Griseofulvin, and Macrobid [nitrofurantoin monohyd macro]   History reviewed. No pertinent family history.  Social History Social History   Tobacco Use  . Smoking status: Never Smoker  . Smokeless tobacco: Never Used  Substance Use Topics  . Alcohol use: No  . Drug use: Never    Review of Systems  Constitutional:   No fever or chills.  ENT:   No sore throat. No rhinorrhea. Cardiovascular:   No chest pain or syncope. Respiratory:   No dyspnea or cough. Gastrointestinal:   Negative for abdominal pain, vomiting and diarrhea.  Musculoskeletal:   Negative for focal pain or swelling All other systems reviewed and are negative except as documented above in ROS and HPI.  ____________________________________________   PHYSICAL EXAM:  VITAL SIGNS: ED Triage Vitals  Enc Vitals Group     BP 01/01/20 2248 (!) 194/74     Pulse Rate 01/01/20 2248 66     Resp 01/01/20 2248 18     Temp 01/01/20 2248 97.8 F (36.6 C)     Temp Source 01/01/20 2248 Oral     SpO2 01/01/20 2248 98 %     Weight --      Height --      Head Circumference --      Peak Flow --      Pain Score 01/01/20 2254 0     Pain Loc --      Pain Edu? --      Excl. in [place]? --     Vital signs reviewed, nursing assessments reviewed.   Constitutional:   Alert and oriented. Non-toxic appearance. Eyes:   Conjunctivae are normal. EOMI. PERRL. ENT      Head:   Normocephalic and atraumatic.      Nose:   Wearing a mask.      Mouth/Throat:   Wearing a mask.      Neck:   No meningismus. Full ROM. Hematological/Lymphatic/Immunilogical:   No cervical lymphadenopathy. Cardiovascular:   RRR. Symmetric bilateral radial and DP pulses.  No murmurs. Cap refill less than 2 seconds. Respiratory:   Normal respiratory  effort without tachypnea/retractions. Breath sounds are clear and equal bilaterally. No wheezes/rales/rhonchi. Gastrointestinal:   Soft and nontender. Non distended. There is no CVA tenderness.  No rebound, rigidity, or guarding. Genitourinary:   deferred Musculoskeletal:   Normal range of motion in all extremities. No joint effusions.  No lower extremity tenderness.  No edema. Neurologic:   Normal speech and language.  Motor grossly intact. No acute focal neurologic deficits are appreciated.  Skin:    Skin is warm, dry and intact. No rash noted.  No petechiae, purpura, or bullae.  ____________________________________________    LABS (pertinent positives/negatives) (all labs ordered are listed, but only abnormal results are displayed) Labs Reviewed  BASIC METABOLIC PANEL - Abnormal; Notable for the following components:      Result Value  Sodium 129 (*)    Chloride 94 (*)    Glucose, Bld 112 (*)    Calcium 8.4 (*)    GFR, Estimated 60 (*)    All other components within normal limits  CBC - Abnormal; Notable for the following components:   RDW 16.9 (*)    All other components within normal limits  URINALYSIS, COMPLETE (UACMP) WITH MICROSCOPIC  TROPONIN I (HIGH SENSITIVITY)  TROPONIN I (HIGH SENSITIVITY)   ____________________________________________   EKG  Interpreted by me Normal sinus rhythm rate of sixty-five, normal axis and intervals.  Poor R wave progression.  Normal ST segments and T waves.  ____________________________________________    RADIOLOGY  No results found.  ____________________________________________   PROCEDURES Procedures  ____________________________________________  DIFFERENTIAL DIAGNOSIS   Prednisone side effect, subtherapeutic antihypertensive, electrolyte normality, anxiety  CLINICAL IMPRESSION / ASSESSMENT AND PLAN / ED COURSE  Medications ordered in the ED: Medications - No data to display  Pertinent labs & imaging results  that were available during my care of the patient were reviewed by me and considered in my medical decision making (see chart for details).  Brandy Hunt was evaluated in Emergency Department on 01/02/2020 for the symptoms described in the history of present illness. She was evaluated in the context of the global COVID-19 pandemic, which necessitated consideration that the patient might be at risk for infection with the SARS-CoV-2 virus that causes COVID-19. Institutional protocols and algorithms that pertain to the evaluation of patients at risk for COVID-19 are in a state of rapid change based on information released by regulatory bodies including the CDC and federal and state organizations. These policies and algorithms were followed during the patient's care in the ED.   Presents with elevated blood pressure, one ninety systolic at home.  She just restarted antihypertensives yesterday, has taken only one dose, and is currently on a prednisone taper as well.  Anticipate the blood pressure control will improve as she continues losartan daily and as she tapers off of the prednisone.  He is completely asymptomatic, so we'll make no changes at this time, recommend she continue her taper and continue her daily losartan, follow-up with PCP in a week for blood pressure recheck.  ----------------------------------------- 5:52 AM on 01/02/2020 -----------------------------------------  Blood pressure continues to improve, currently 132/88.      ____________________________________________   FINAL CLINICAL IMPRESSION(S) / ED DIAGNOSES    Final diagnoses:  Hypertension, unspecified type     ED Discharge Orders    None      Portions of this note were generated with dragon dictation software. Dictation errors may occur despite best attempts at proofreading.   Carrie Mew, MD 01/02/20 [phone]

## 2020-01-25 ENCOUNTER — Emergency Department: Payer: Medicare Other

## 2020-01-25 ENCOUNTER — Emergency Department
Admission: EM | Admit: 2020-01-25 | Discharge: 2020-01-25 | Disposition: A | Discharge status: Home / Self Care | Payer: Medicare Other | Attending: Student in an Organized Health Care Education/Training Program | Admitting: Student in an Organized Health Care Education/Training Program

## 2020-01-25 ENCOUNTER — Encounter: Payer: Self-pay | Admitting: Emergency Medicine

## 2020-01-25 ENCOUNTER — Other Ambulatory Visit: Payer: Self-pay

## 2020-01-25 DIAGNOSIS — Z7901 Long term (current) use of anticoagulants: Secondary | ICD-10-CM | POA: Diagnosis not present

## 2020-01-25 DIAGNOSIS — Z85828 Personal history of other malignant neoplasm of skin: Secondary | ICD-10-CM | POA: Insufficient documentation

## 2020-01-25 DIAGNOSIS — I25118 Atherosclerotic heart disease of native coronary artery with other forms of angina pectoris: Secondary | ICD-10-CM | POA: Insufficient documentation

## 2020-01-25 DIAGNOSIS — I10 Essential (primary) hypertension: Secondary | ICD-10-CM | POA: Diagnosis not present

## 2020-01-25 DIAGNOSIS — R519 Headache, unspecified: Secondary | ICD-10-CM | POA: Diagnosis present

## 2020-01-25 DIAGNOSIS — Z853 Personal history of malignant neoplasm of breast: Secondary | ICD-10-CM | POA: Insufficient documentation

## 2020-01-25 DIAGNOSIS — Z79899 Other long term (current) drug therapy: Secondary | ICD-10-CM | POA: Diagnosis not present

## 2020-01-25 DIAGNOSIS — R202 Paresthesia of skin: Secondary | ICD-10-CM | POA: Diagnosis not present

## 2020-01-25 LAB — BASIC METABOLIC PANEL
Anion gap: 7 (ref 5–15)
BUN: 19 mg/dL (ref 8–23)
CO2: 30 mmol/L (ref 22–32)
Calcium: 8.7 mg/dL — ABNORMAL LOW (ref 8.9–10.3)
Chloride: 98 mmol/L (ref 98–111)
Creatinine, Ser: 0.89 mg/dL (ref 0.44–1.00)
GFR, Estimated: >60 mL/min (ref >60)
Glucose, Bld: 101 mg/dL — ABNORMAL HIGH (ref 70–99)
Potassium: 4.5 mmol/L (ref 3.5–5.1)
Sodium: 135 mmol/L (ref 135–145)

## 2020-01-25 LAB — CBC WITH DIFFERENTIAL/PLATELET
Abs Immature Granulocytes: 0.2 10*3/uL — ABNORMAL HIGH (ref 0.00–0.07)
Basophils Absolute: 0 10*3/uL (ref 0.0–0.1)
Basophils Relative: 1 %
Eosinophils Absolute: 0 10*3/uL (ref 0.0–0.5)
Eosinophils Relative: 0 %
HCT: 38.3 % (ref 36.0–46.0)
Hemoglobin: 12.8 g/dL (ref 12.0–15.0)
Immature Granulocytes: 3 %
Lymphocytes Relative: 8 %
Lymphs Abs: 0.7 10*3/uL (ref 0.7–4.0)
MCH: 30.4 pg (ref 26.0–34.0)
MCHC: 33.4 g/dL (ref 30.0–36.0)
MCV: 91 fL (ref 80.0–100.0)
Monocytes Absolute: 0.5 10*3/uL (ref 0.1–1.0)
Monocytes Relative: 7 %
Neutro Abs: 6.4 10*3/uL (ref 1.7–7.7)
Neutrophils Relative %: 81 %
Platelets: 284 10*3/uL (ref 150–400)
RBC: 4.21 MIL/uL (ref 3.87–5.11)
RDW: 18.5 % — ABNORMAL HIGH (ref 11.5–15.5)
WBC: 7.9 10*3/uL (ref 4.0–10.5)
nRBC: 0 % (ref 0.0–0.2)

## 2020-01-25 LAB — TROPONIN I (HIGH SENSITIVITY)
Troponin I (High Sensitivity): 8 ng/L (ref <18)
Troponin I (High Sensitivity): 5 ng/L (ref <18)

## 2020-01-25 LAB — SEDIMENTATION RATE: Sed Rate: 8 mm/hr (ref 0–22)

## 2020-01-25 MED ORDER — IOHEXOL 350 MG/ML SOLN
75.0000 mL | Freq: Once | INTRAVENOUS | Status: AC | PRN
  Administered 2020-01-25: 75 mL via INTRAVENOUS

## 2020-01-25 MED ORDER — ACETAMINOPHEN 500 MG PO TABS
1000.0000 mg | ORAL_TABLET | Freq: Once | ORAL | Status: AC
  Administered 2020-01-25: 1000 mg via ORAL
  Filled 2020-01-25: qty 2

## 2020-01-25 NOTE — ED Triage Notes (Signed)
  Patient arrives by EMS for headache that has been going on since 1300 yesterday afternoon.  Patient states she has headache on L side of head with 10/10 pain. No hx of headaches/migraines.  Denies alterations in vision, gait, or weakness.  No OTC medications.  Patient takes blood thinners and had a fall 2-3 weeks ago.

## 2020-01-25 NOTE — ED Notes (Signed)
Attempted to contact son Jenny Reichmann as requested by pt, no answer, VM left

## 2020-01-25 NOTE — ED Notes (Signed)
Pt back from CT, call bell within reach, denies needs

## 2020-01-25 NOTE — ED Notes (Signed)
Dr Quentin Cornwall at bedside.  Pt c.o headache, unknown times, not giving details. States worse headache of life.  Alert and oriented x4.

## 2020-01-25 NOTE — Discharge Instructions (Addendum)

## 2020-01-25 NOTE — ED Notes (Signed)
Pt to CT

## 2020-01-25 NOTE — ED Provider Notes (Signed)
[facility] Emergency Department Provider Note    Event Date/Time   First MD Initiated Contact with Patient 01/25/20 [phone]     (approximate)  I have reviewed the triage vital signs and the nursing notes.   HISTORY  Chief Complaint Headache    HPI Brandy Hunt is a 86 y.o. female the below listed past medical history presents to the ER for evaluation of sudden onset left-sided headache is severe in nature started around 1 or 2:00 yesterday.  Cannot describe how long it lasted.  Still having some mild discomfort.  Feels like she has some tingling in her left arm.  No recent fevers.  No history of headaches.  This is the worst headache of her life.  No neck stiffness.    Past Medical History:  Diagnosis Date   CAD (coronary artery disease)    Cancer ([place])    Glaucoma    Hypertension    Keratosis    MI, old 2002   Myalgia    Osteopenia    Paroxysmal A-fib ([place])    Raynaud disease    History reviewed. No pertinent family history. Past Surgical History:  Procedure Laterality Date   BREAST SURGERY     Patient Active Problem List   Diagnosis Date Noted   Atrial flutter with rapid ventricular response ([place]) 11/25/2018   Bilateral hand pain 08/10/2018   Bilateral hand numbness 08/10/2018   Impaired glucose tolerance 06/08/2018   B12 deficiency 06/08/2018   Weakness of both hands 06/08/2018   Gait disturbance 03/17/2017   Hyperkalemia 10/20/2016   Anxiety 10/20/2015   Heart palpitations 09/10/2015   Bilateral carpal tunnel syndrome 05/09/2015   CRI (chronic renal insufficiency), stage 3 (moderate) ([place]) 04/18/2015   High risk medication use 10/14/2014   Muscle cramps 04/08/2014   Hypercholesterolemia 04/08/2014   Coronary artery disease of native artery of native heart with stable angina pectoris ([place]) 04/08/2014   Benign essential hypertension 09/07/2013   Seasonal allergic rhinitis 09/07/2013   Femoral bruit  04/17/2013   Raynauds phenomenon 03/30/2012   Myalgia 03/31/2011   Osteopenia 03/31/2011   Paroxysmal atrial fibrillation ([place]) 03/31/2011   Insomnia 08/31/2010   History of squamous cell carcinoma 08/31/2010   History of breast cancer in female 08/31/2010   History of actinic keratosis 08/31/2010   Glaucoma, closed-angle 08/31/2010   Chronic constipation 08/31/2010   Atopic dermatitis 08/31/2010      Prior to Admission medications   Medication Sig Start Date End Date Taking? Authorizing Provider  apixaban (ELIQUIS) 5 MG TABS tablet Take 1 tablet (5 mg total) by mouth 2 (two) times daily. 11/26/18   Bettey Costa, MD  Cholecalciferol (VITAMIN D) 2000 UNITS tablet Take 2,000 Units by mouth daily.    [provider]  cyanocobalamin 1000 MCG tablet Take 1,000 mcg by mouth daily.    [provider]  ezetimibe (ZETIA) 10 MG tablet Take 10 mg by mouth daily.    [provider]  fexofenadine (ALLEGRA) 180 MG tablet Take 180 mg by mouth daily.    [provider]  fluticasone (FLONASE) 50 MCG/ACT nasal spray Place into both nostrils daily.    [provider]  ketoconazole (NIZORAL) 2 % cream Apply 1 application topically daily as needed for irritation.    [provider]  losartan (COZAAR) 25 MG tablet Take 1 tablet (25 mg total) by mouth daily. 11/26/18   Bettey Costa, MD  magnesium oxide (MAG-OX) 400 MG tablet Take 1 tablet by  mouth daily.     [provider]  metoprolol succinate (TOPROL-XL) 100 MG 24 hr tablet Take 1 tablet (100 mg total) by mouth daily. Take with or immediately following a meal. 11/26/18   Bettey Costa, MD  nitroGLYCERIN (NITROSTAT) 0.4 MG SL tablet Place 0.4 mg under the tongue every 5 (five) minutes as needed for chest pain.    [provider]  polyethylene glycol (MIRALAX / GLYCOLAX) packet Take 17 g by mouth daily.    [provider]    Allergies Erythromycin, Sulfites,  Nitrofurantoin, Cephalexin, Ciprofloxacin, Niacin and related, Penicillins, Ramipril, Septra [sulfamethoxazole-trimethoprim], Statins, Griseofulvin, and Macrobid [nitrofurantoin monohyd macro]    Social History Social History   Tobacco Use   Smoking status: Never Smoker   Smokeless tobacco: Never Used  Substance Use Topics   Alcohol use: No   Drug use: Never    Review of Systems Patient denies headaches, rhinorrhea, blurry vision, numbness, shortness of breath, chest pain, edema, cough, abdominal pain, nausea, vomiting, diarrhea, dysuria, fevers, rashes or hallucinations unless otherwise stated above in HPI. ____________________________________________   PHYSICAL EXAM:  VITAL SIGNS: Vitals:   01/25/20 0329 01/25/20 0822  BP: (!) 142/53 (!) 129/51  Pulse: 64 61  Resp: 18 17  Temp: 97.9 F (36.6 C) 97.7 F (36.5 C)  SpO2: 99% 100%    Constitutional: Alert and oriented.  Eyes: Conjunctivae are normal.  Head: Atraumatic.  No ttp of temporal arteries Nose: No congestion/rhinnorhea. Mouth/Throat: Mucous membranes are moist.   Neck: No stridor. Painless ROM.  Cardiovascular: Normal rate, regular rhythm. Grossly normal heart sounds.  Good peripheral circulation. Respiratory: Normal respiratory effort.  No retractions. Lungs CTAB. Gastrointestinal: Soft and nontender. No distention. No abdominal bruits. No CVA tenderness. Genitourinary:  Musculoskeletal: No lower extremity tenderness nor edema.  No joint effusions. Neurologic:  Normal speech and language. No gross focal neurologic deficits are appreciated. No facial droop Skin:  Skin is warm, dry and intact. No rash noted. Psychiatric: Mood and affect are normal. Speech and behavior are normal.  ____________________________________________   LABS (all labs ordered are listed, but only abnormal results are displayed)  Results for orders placed or performed during the hospital encounter of 01/25/20 (from the past 24  hour(s))  CBC with Differential     Status: Abnormal   Collection Time: 01/25/20  3:38 AM  Result Value Ref Range   WBC 7.9 4.0 - 10.5 K/uL   RBC 4.21 3.87 - 5.11 MIL/uL   Hemoglobin 12.8 12.0 - 15.0 g/dL   HCT 38.3 36.0 - 46.0 %   MCV 91.0 80.0 - 100.0 fL   MCH 30.4 26.0 - 34.0 pg   MCHC 33.4 30.0 - 36.0 g/dL   RDW 18.5 (H) 11.5 - 15.5 %   Platelets 284 150 - 400 K/uL   nRBC 0.0 0.0 - 0.2 %   Neutrophils Relative % 81 %   Neutro Abs 6.4 1.7 - 7.7 K/uL   Lymphocytes Relative 8 %   Lymphs Abs 0.7 0.7 - 4.0 K/uL   Monocytes Relative 7 %   Monocytes Absolute 0.5 0.1 - 1.0 K/uL   Eosinophils Relative 0 %   Eosinophils Absolute 0.0 0.0 - 0.5 K/uL   Basophils Relative 1 %   Basophils Absolute 0.0 0.0 - 0.1 K/uL   Immature Granulocytes 3 %   Abs Immature Granulocytes 0.20 (H) 0.00 - 0.07 K/uL  Basic metabolic panel     Status: Abnormal   Collection Time: 01/25/20  3:38 AM  Result Value Ref Range   Sodium 135 135 - 145 mmol/L   Potassium 4.5 3.5 - 5.1 mmol/L   Chloride 98 98 - 111 mmol/L   CO2 30 22 - 32 mmol/L   Glucose, Bld 101 (H) 70 - 99 mg/dL   BUN 19 8 - 23 mg/dL   Creatinine, Ser 0.89 0.44 - 1.00 mg/dL   Calcium 8.7 (L) 8.9 - 10.3 mg/dL   GFR, Estimated >60 >60 mL/min   Anion gap 7 5 - 15  Troponin I (High Sensitivity)     Status: None   Collection Time: 01/25/20  3:38 AM  Result Value Ref Range   Troponin I (High Sensitivity) 8 <18 ng/L   ____________________________________________  EKG My review and personal interpretation at Time: 3:34   Indication: headache  Rate: 65  Rhythm: sinus Axis: normal Other: normal intervals, no stemi, nonspecific st abn with motion artifact ____________________________________________  RADIOLOGY  I personally reviewed all radiographic images ordered to evaluate for the above acute complaints and reviewed radiology reports and findings.  These findings were personally discussed with the patient.  Please see medical record for  radiology report.  ____________________________________________   PROCEDURES  Procedure(s) performed:  Procedures    Critical Care performed: no ____________________________________________   INITIAL IMPRESSION / ASSESSMENT AND PLAN / ED COURSE  Pertinent labs & imaging results that were available during my care of the patient were reviewed by me and considered in my medical decision making (see chart for details).   DDX: Tension, cluster, migraine, SAH, aneurysm, GCA, glaucoma  Brandy Hunt is a 86 y.o. who presents to the ED with symptoms of severe sudden onset headache as described above. No neuro deficits. CT head with no evidence of hemorrhage but given risk factors will order CTA to exclude aneurysm as this may be sentinel bleed. No infectious symptoms. Have a lower suspicion for GCA. Doubt glaucoma. Possible migraine or tension headache.  Clinical Course as of 01/25/20 1347  Fri Jan 25, 2020  0909 IOP is 9.  No visual disturbance to suggest hypotony.  Not c/w glaucoma.  [PR]  1308 Patient reassessed. Feels much improved. Tolerating p.o. CT imaging reassuring. [PR]  1345 ESR negative. Not consistent with GCA. Given reassuring work-up does appear appropriate for outpatient follow-up. [PR]    Clinical Course User Index [PR] Merlyn Lot, MD    The patient was evaluated in Emergency Department today for the symptoms described in the history of present illness. He/she was evaluated in the context of the global COVID-19 pandemic, which necessitated consideration that the patient might be at risk for infection with the SARS-CoV-2 virus that causes COVID-19. Institutional protocols and algorithms that pertain to the evaluation of patients at risk for COVID-19 are in a state of rapid change based on information released by regulatory bodies including the CDC and federal and state organizations. These policies and algorithms were followed during the patient's care in the  ED.  As part of my medical decision making, I reviewed the following data within the [place] notes reviewed and incorporated, Labs reviewed, notes from prior ED visits and [place] Controlled Substance Database   ____________________________________________   FINAL CLINICAL IMPRESSION(S) / ED DIAGNOSES  Final diagnoses:  Bad headache      NEW MEDICATIONS STARTED DURING THIS VISIT:  New Prescriptions   No medications on file     Note:  This document was prepared using Dragon voice recognition software and may include unintentional dictation errors.  Merlyn Lot, MD 01/25/20 1348

## 2020-01-25 NOTE — ED Notes (Signed)
Pt provided graham crackers, PB and ice water

## 2020-01-25 NOTE — ED Notes (Signed)
First encounter with pt. To D/C. VSS. NAD noted.

## 2020-03-27 ENCOUNTER — Other Ambulatory Visit: Payer: Self-pay | Admitting: Family Medicine

## 2020-03-27 ENCOUNTER — Ambulatory Visit
Admission: RE | Admit: 2020-03-27 | Discharge: 2020-03-27 | Disposition: A | Discharge status: Home / Self Care | Payer: Medicare Other | Attending: Family Medicine | Admitting: Family Medicine

## 2020-03-27 ENCOUNTER — Ambulatory Visit
Admission: RE | Admit: 2020-03-27 | Discharge: 2020-03-27 | Disposition: A | Discharge status: Home / Self Care | Payer: Medicare Other | Source: Ambulatory Visit | Attending: Family Medicine | Admitting: Family Medicine

## 2020-03-27 DIAGNOSIS — R52 Pain, unspecified: Secondary | ICD-10-CM

## 2022-06-15 IMAGING — CR DG FOOT COMPLETE 3+V*R*
3 series · 3 of 3 positions shown · non-contrast
Comparison: None.

CLINICAL DATA: Right foot pain, trauma 3 days ago

EXAM:
RIGHT FOOT COMPLETE - 3+ VIEW

[foot ap]
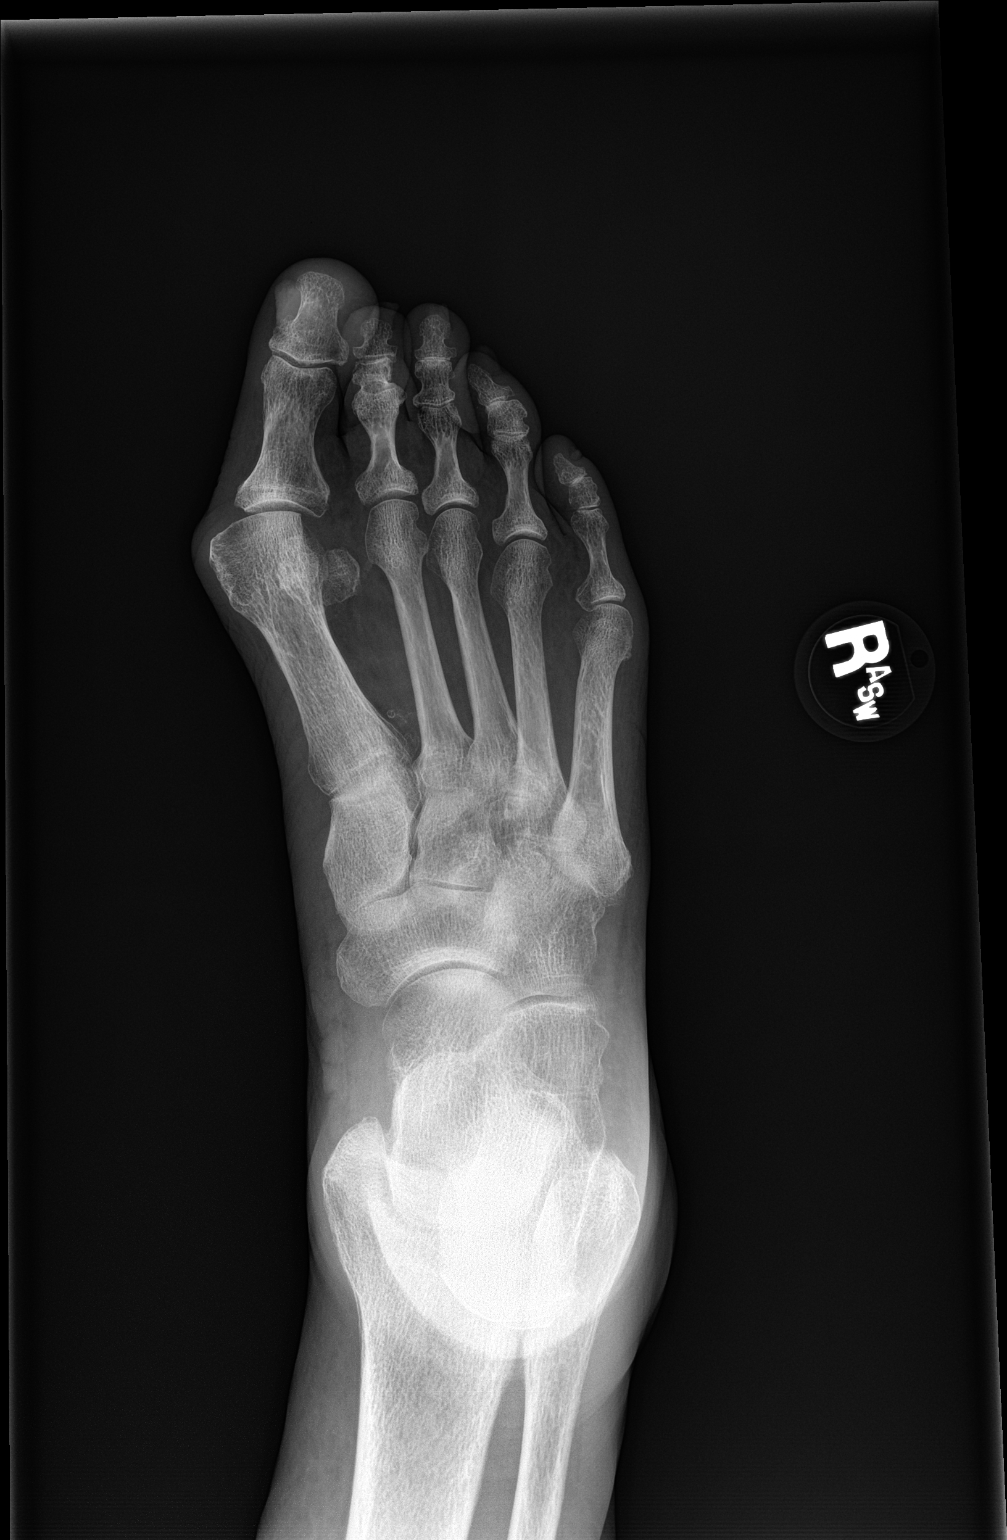

[foot obl]
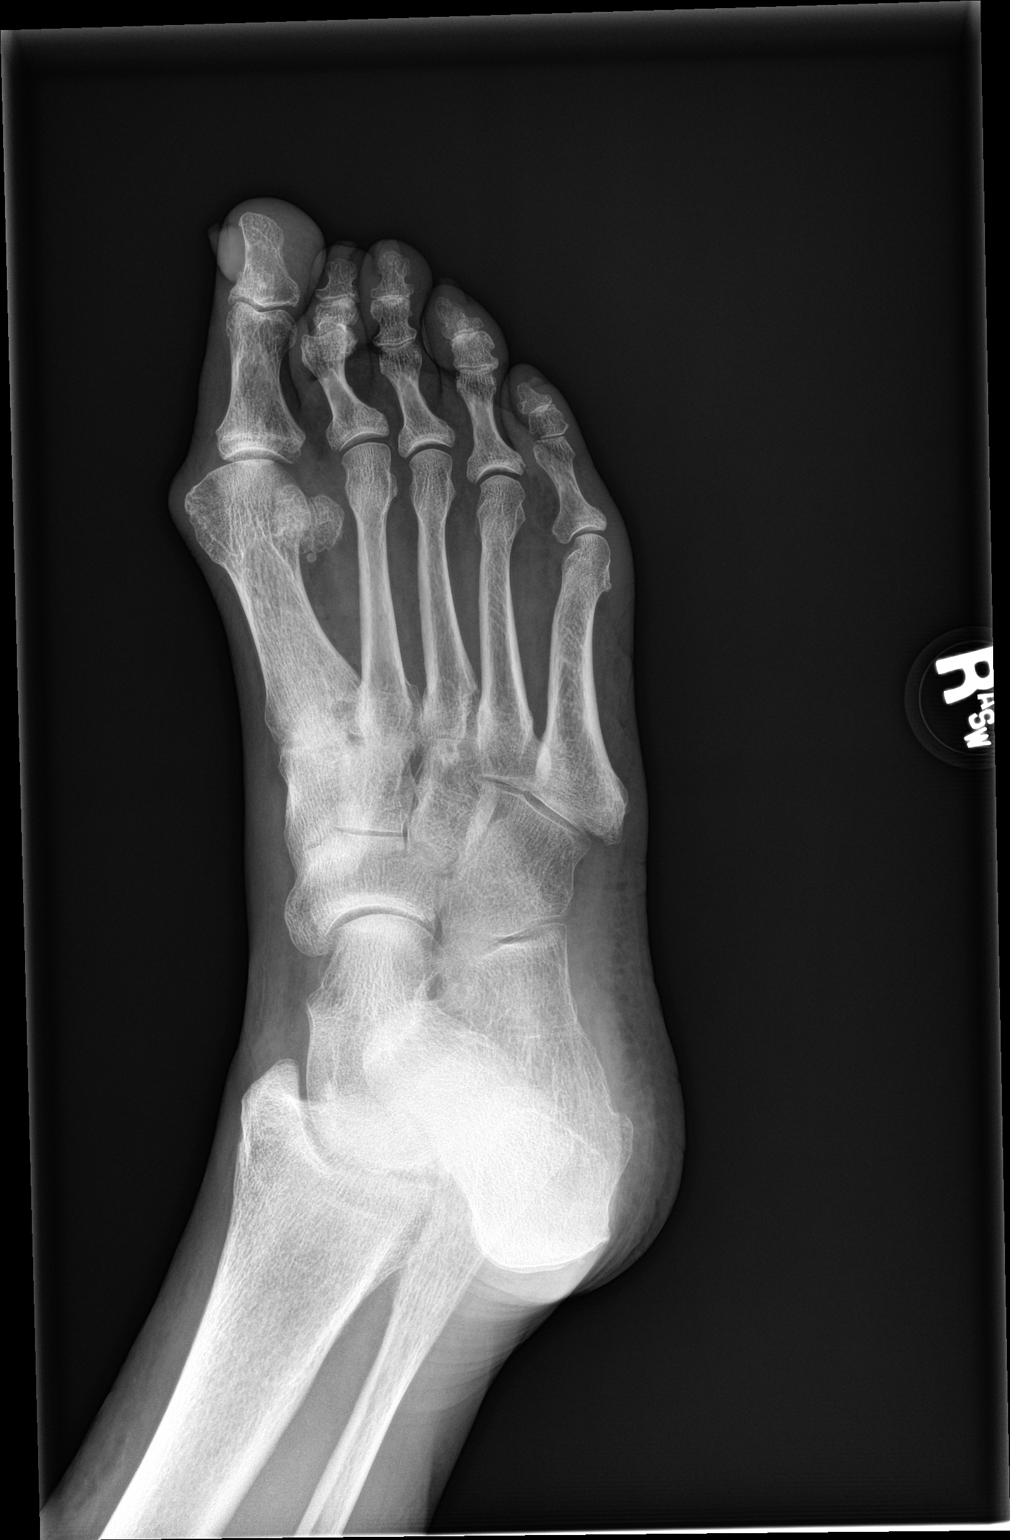

[foot lat]
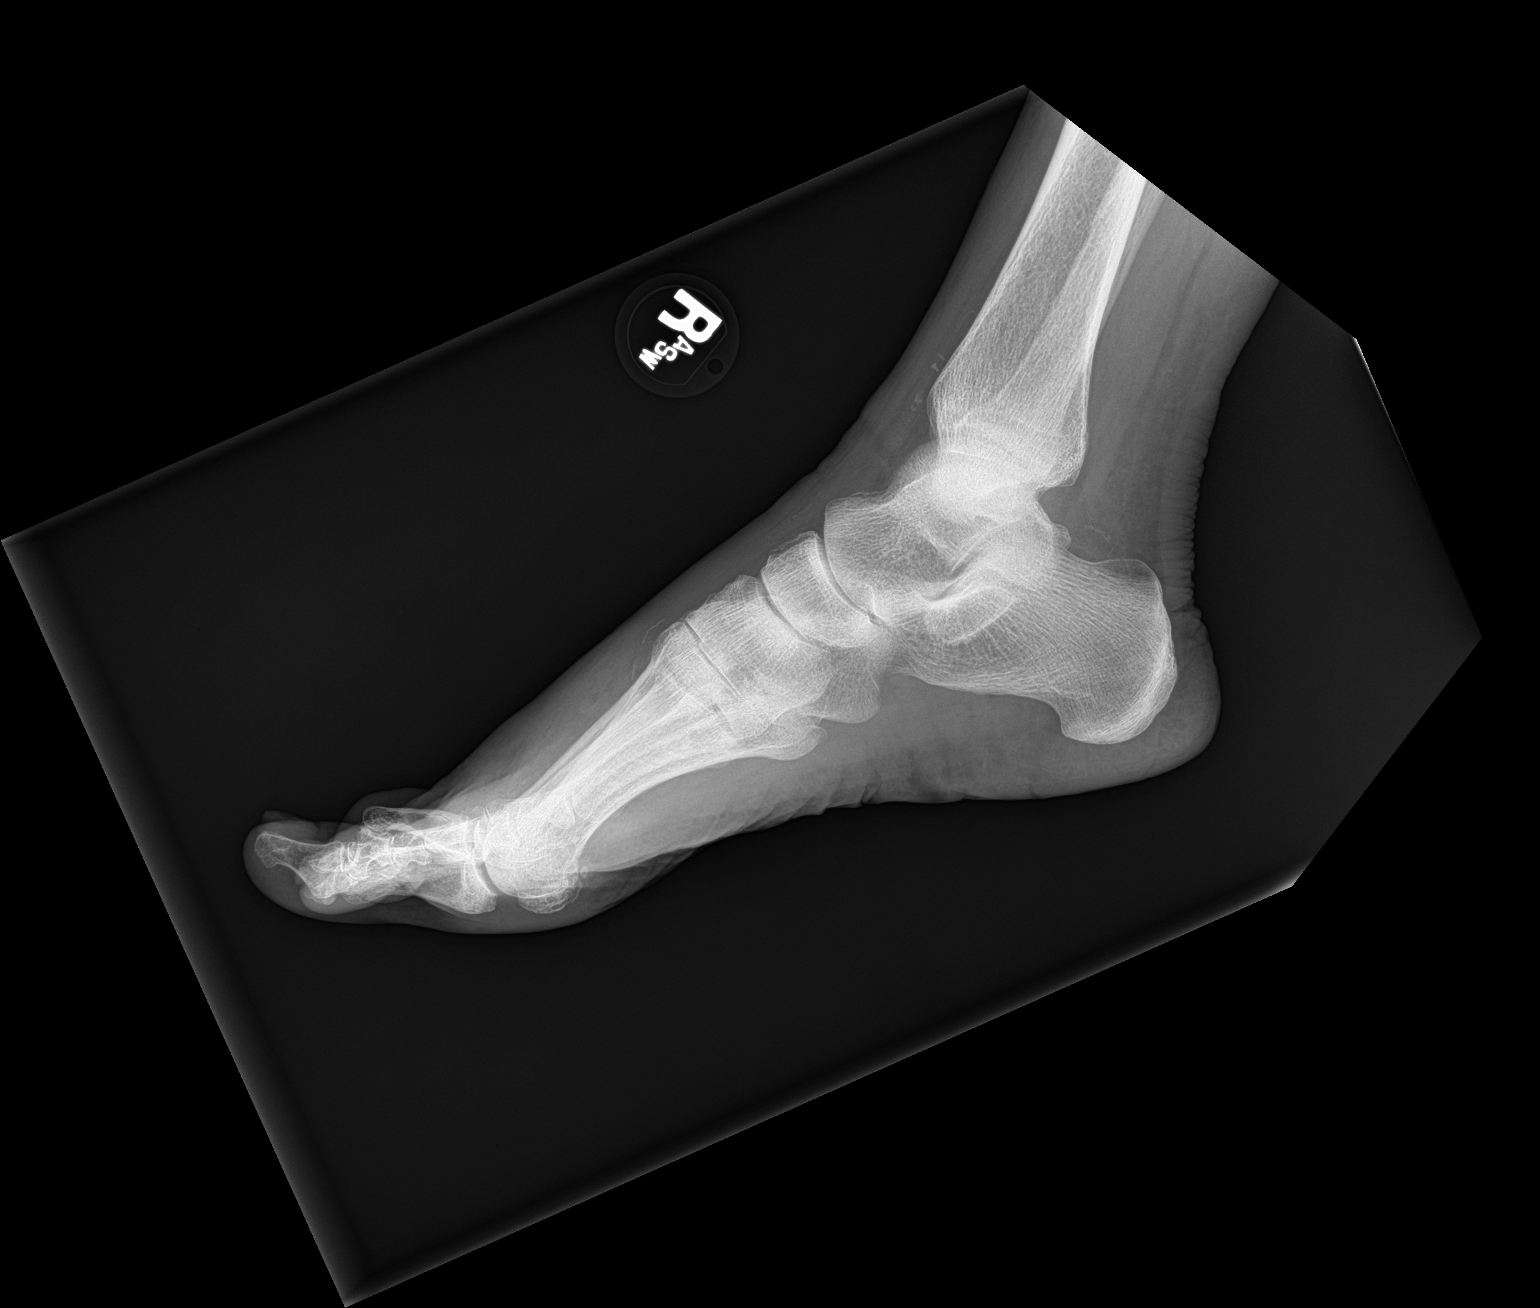

[3 of 3 positions shown; findings below may reference images not displayed]

FINDINGS: Frontal, oblique, and lateral views of the right foot are obtained.
No acute displaced fractures. Severe hallux valgus deformity with
lateral subluxation of the first proximal phalanx relative to the
first metatarsal head. Mild associated osteoarthritis. Remaining
joint spaces are well preserved. There is dorsal soft tissue
swelling of the forefoot. Mild atherosclerosis.
IMPRESSION: 1. Dorsal soft tissue swelling of the forefoot. No acute displaced
fracture.
2. Significant hallux valgus deformity with mild associated
osteoarthritis.
